# Patient Record
Sex: Female | Born: 1995 | Race: Black or African American | Hispanic: No | Marital: Single | Smoking: Current some day smoker
Health system: Southern US, Community
[De-identification: ages and names within clinical notes are randomized; demographics above are authoritative.]

## PROBLEM LIST (undated history)

## (undated) ENCOUNTER — Inpatient Hospital Stay (HOSPITAL_COMMUNITY): Payer: Self-pay

## (undated) DIAGNOSIS — D649 Anemia, unspecified: Secondary | ICD-10-CM

## (undated) DIAGNOSIS — N189 Chronic kidney disease, unspecified: Secondary | ICD-10-CM

## (undated) DIAGNOSIS — F419 Anxiety disorder, unspecified: Secondary | ICD-10-CM

## (undated) DIAGNOSIS — J45909 Unspecified asthma, uncomplicated: Secondary | ICD-10-CM

## (undated) HISTORY — PX: NO PAST SURGERIES: SHX2092

## (undated) HISTORY — PX: WISDOM TOOTH EXTRACTION: SHX21

---
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
## 2013-11-18 NOTE — L&D Delivery Note (Signed)
Delivery Note At 8:44 AM a viable and healthy female was delivered via  (Presentation: LOA ).  APGAR: 8, 8; weight: pending .   Placenta status: Intact, Spontaneous.  Cord: 3V  with the following complications: none .  Cord pH: n/a  Anesthesia: Epidural  Episiotomy: n/a Lacerations: n/a Suture Repair: n/a Est. Blood Loss (mL): 250  Mom to postpartum.  Baby to Couplet care / Skin to Skin.  Pt pushed with good maternal effort to deliver a liveborn female via NSVD with spontaneous cry.   Baby placed on maternal abdomen.  Delayed cord clamping performed.  Cord cut by MOB.  Placenta delivered intact with 3V cord via traction and pitocin.  No tear. No complications.  Mom and baby to postpartum.   Myra RudeSchmitz, Jeremy E 05/12/2014, 8:58 AM   I was present for and supervised the delivery of this newborn. I agree with above documentation by the resident.   BECK, Redmond BasemanKELI L, MD

## 2014-01-23 ENCOUNTER — Encounter (HOSPITAL_COMMUNITY): Payer: Self-pay | Admitting: Emergency Medicine

## 2014-01-23 ENCOUNTER — Emergency Department (HOSPITAL_COMMUNITY)
Admission: EM | Admit: 2014-01-23 | Discharge: 2014-01-24 | Disposition: A | Discharge status: Home / Self Care | Payer: Medicaid Other | Attending: Emergency Medicine | Admitting: Emergency Medicine

## 2014-01-23 DIAGNOSIS — R011 Cardiac murmur, unspecified: Secondary | ICD-10-CM | POA: Insufficient documentation

## 2014-01-23 DIAGNOSIS — R110 Nausea: Secondary | ICD-10-CM | POA: Insufficient documentation

## 2014-01-23 DIAGNOSIS — O99019 Anemia complicating pregnancy, unspecified trimester: Secondary | ICD-10-CM | POA: Insufficient documentation

## 2014-01-23 DIAGNOSIS — D649 Anemia, unspecified: Secondary | ICD-10-CM

## 2014-01-23 DIAGNOSIS — O9989 Other specified diseases and conditions complicating pregnancy, childbirth and the puerperium: Secondary | ICD-10-CM | POA: Insufficient documentation

## 2014-01-23 DIAGNOSIS — J45909 Unspecified asthma, uncomplicated: Secondary | ICD-10-CM | POA: Insufficient documentation

## 2014-01-23 DIAGNOSIS — R51 Headache: Secondary | ICD-10-CM | POA: Insufficient documentation

## 2014-01-23 DIAGNOSIS — R519 Headache, unspecified: Secondary | ICD-10-CM

## 2014-01-23 DIAGNOSIS — R42 Dizziness and giddiness: Secondary | ICD-10-CM | POA: Insufficient documentation

## 2014-01-23 LAB — CBC WITH DIFFERENTIAL/PLATELET
BASOS PCT: 0 % (ref 0–1)
Basophils Absolute: 0 10*3/uL (ref 0.0–0.1)
Eosinophils Absolute: 0.3 10*3/uL (ref 0.0–0.7)
Eosinophils Relative: 3 % (ref 0–5)
HEMATOCRIT: 26 % — AB (ref 36.0–46.0)
HEMOGLOBIN: 8.1 g/dL — AB (ref 12.0–15.0)
LYMPHS ABS: 1.7 10*3/uL (ref 0.7–4.0)
LYMPHS PCT: 18 % (ref 12–46)
MCH: 23.3 pg — ABNORMAL LOW (ref 26.0–34.0)
MCHC: 31.2 g/dL (ref 30.0–36.0)
MCV: 74.9 fL — ABNORMAL LOW (ref 78.0–100.0)
MONO ABS: 0.5 10*3/uL (ref 0.1–1.0)
MONOS PCT: 5 % (ref 3–12)
NEUTROS ABS: 7.4 10*3/uL (ref 1.7–7.7)
NEUTROS PCT: 75 % (ref 43–77)
Platelets: 309 10*3/uL (ref 150–400)
RBC: 3.47 MIL/uL — AB (ref 3.87–5.11)
RDW: 17 % — ABNORMAL HIGH (ref 11.5–15.5)
WBC: 9.8 10*3/uL (ref 4.0–10.5)

## 2014-01-23 LAB — COMPREHENSIVE METABOLIC PANEL
ALK PHOS: 63 U/L (ref 39–117)
ALT: 7 U/L (ref 0–35)
AST: 15 U/L (ref 0–37)
Albumin: 2.8 g/dL — ABNORMAL LOW (ref 3.5–5.2)
BUN: 5 mg/dL — ABNORMAL LOW (ref 6–23)
CALCIUM: 8.7 mg/dL (ref 8.4–10.5)
CO2: 23 meq/L (ref 19–32)
Chloride: 105 mEq/L (ref 96–112)
Creatinine, Ser: 0.47 mg/dL — ABNORMAL LOW (ref 0.50–1.10)
GFR calc Af Amer: >90 mL/min (ref >90)
GLUCOSE: 82 mg/dL (ref 70–99)
POTASSIUM: 3.4 meq/L — AB (ref 3.7–5.3)
SODIUM: 139 meq/L (ref 137–147)
Total Bilirubin: <0.2 mg/dL — ABNORMAL LOW (ref 0.3–1.2)
Total Protein: 6.3 g/dL (ref 6.0–8.3)

## 2014-01-23 LAB — URINALYSIS, ROUTINE W REFLEX MICROSCOPIC
Bilirubin Urine: NEGATIVE
GLUCOSE, UA: NEGATIVE mg/dL
HGB URINE DIPSTICK: NEGATIVE
Ketones, ur: NEGATIVE mg/dL
LEUKOCYTES UA: NEGATIVE
Nitrite: NEGATIVE
PH: 7 (ref 5.0–8.0)
PROTEIN: NEGATIVE mg/dL
SPECIFIC GRAVITY, URINE: 1.015 (ref 1.005–1.030)
Urobilinogen, UA: 0.2 mg/dL (ref 0.0–1.0)

## 2014-01-23 LAB — POC URINE PREG, ED: Preg Test, Ur: POSITIVE — AB

## 2014-01-23 NOTE — ED Notes (Signed)
Pt states LMP September. States she has not seen MD for prenatal care

## 2014-01-23 NOTE — ED Notes (Addendum)
Pt reports migraine x 3 days with sensitivity to light and loud noises. Pt has took tylenol at home with no relief. No hx migraine in past. Pt also reports that she found out 2 weeks ago that she is pregnant.  She has been experiencing lower sharp abdominal pain with nausea for 2 weeks and feeling shakey all over. Reports some abnormal vaginal discharge.

## 2014-01-23 NOTE — ED Provider Notes (Signed)
CSN: [id number]     Arrival date & time 01/23/14  1940 History   First MD Initiated Contact with Patient 01/23/14 2144     Chief Complaint  Patient presents with  . Migraine     (Consider location/radiation/quality/duration/timing/severity/associated sxs/prior Treatment) HPI Comments: Yvonne Castillo is a 18 y.o. G3P2 female with a past medical history of asthma presenting the Emergency Department with a chief complaint of headache for 3 days.  She reports a gradual onset of a frontal headache.  She reports taking tylenol for the discomfort without relief of symptoms.  She reports associated intermittent vertigo dizziness.  She reports dizziness is not aggravated by head position, body position (seated to standing).  She denies fever or rash.  Reports occasional nausea. Denies abdominal pain, abnormal vaginal bleeding or vaginal discharge. She reports she has not been evaluated for her current pregnancy.  Previous OB/GYN, [facility] [place]. LNMP: September or October 2014. No PCP  Patient is a 18 y.o. female presenting with migraines. The history is provided by the patient. No language interpreter was used.  Migraine Associated symptoms include headaches and nausea. Pertinent negatives include no abdominal pain, chills, congestion, fever, numbness, rash or weakness.    Past Medical History  Diagnosis Date  . Asthma    History reviewed. No pertinent past surgical history. No family history on file. History  Substance Use Topics  . Smoking status: Never Smoker   . Smokeless tobacco: Not on file  . Alcohol Use: No   OB History   Grav Para Term Preterm Abortions TAB SAB Ect Mult Living                 Review of Systems  Constitutional: Negative for fever and chills.  HENT: Negative for congestion, ear pain, sinus pressure and tinnitus.   Eyes: Negative for photophobia and visual disturbance.  Gastrointestinal: Positive for nausea. Negative for abdominal pain, diarrhea and  constipation.  Genitourinary: Negative for dysuria, urgency, hematuria, vaginal bleeding, vaginal discharge and menstrual problem.  Skin: Negative for rash.  Neurological: Positive for dizziness and headaches. Negative for seizures, syncope, weakness, light-headedness and numbness.      Allergies  Review of patient's allergies indicates no known allergies.  Home Medications   Current Outpatient Rx  Name  Route  Sig  Dispense  Refill  . acetaminophen (TYLENOL) 500 MG tablet   Oral   Take 1,000 mg by mouth every 8 (eight) hours as needed for moderate pain.          BP 102/56  Pulse 78  Temp(Src) 98.5 F (36.9 C) (Oral)  Resp 16  SpO2 100%  LMP 08/25/2013 Physical Exam  Nursing note and vitals reviewed. Constitutional: She is oriented to person, place, and time. She appears well-developed and well-nourished. No distress.  HENT:  Head: Normocephalic and atraumatic.  Eyes: EOM are normal. Pupils are equal, round, and reactive to light. Right eye exhibits no nystagmus. Left eye exhibits no nystagmus.  Speech is clear and goal oriented, follows commands Cranial nerves III - XII grossly intact, no facial droop Normal strength in upper and lower extremities bilaterally, strong and equal grip strength Sensation normal to light and sharp touch Moves all 4 extremities without ataxia, coordination intact, normal gait.     Neck: Normal range of motion. Neck supple.  Cardiovascular: Normal rate and regular rhythm.   Murmur heard. Pulmonary/Chest: Effort normal and breath sounds normal. She has no wheezes. She has no rales.  Abdominal: Soft. There  is no tenderness. There is no rebound.  Gravid uterus, fundal height measuring 27 cm  Neurological: She is alert and oriented to person, place, and time. No cranial nerve deficit. Coordination normal.  Skin: Skin is warm. She is not diaphoretic.    ED Course  Procedures (including critical care time) Labs Review Labs Reviewed  CBC  WITH DIFFERENTIAL - Abnormal; Notable for the following:    RBC 3.47 (*)    Hemoglobin 8.1 (*)    HCT 26.0 (*)    MCV 74.9 (*)    MCH 23.3 (*)    RDW 17.0 (*)    All other components within normal limits  COMPREHENSIVE METABOLIC PANEL - Abnormal; Notable for the following:    Potassium 3.4 (*)    BUN 5 (*)    Creatinine, Ser 0.47 (*)    Albumin 2.8 (*)    Total Bilirubin <0.2 (*)    All other components within normal limits  POC URINE PREG, ED - Abnormal; Notable for the following:    Preg Test, Ur POSITIVE (*)    All other components within normal limits  URINALYSIS, ROUTINE W REFLEX MICROSCOPIC   Imaging Review No results found.   EKG Interpretation None      MDM   Final diagnoses:  Headache  Anemia  Murmur   Pt currently pregnant pt without having prenatal care presents with gradual onset of a Right sided and frontal head ache with associated dizziness. No neurologic deficits on exam, specifially no nystagmys, peripheral vision loss. Bedside US performed, IUP, with a HR approximately 160 BPM. Reglan and benadryl ordered. 0119: Pt reports headache improved. Discussed lab results,and treatment plan with the patient.  Discussed establishing care with an OB/GYN in the area. Return precautions given. Reports understanding and no other concerns at this time.  Patient is stable for discharge at this time.  Meds given in ED:  Medications  metoCLOPramide (REGLAN) injection 10 mg (10 mg Intravenous Given 01/24/14 0018)  diphenhydrAMINE (BENADRYL) injection 25 mg (25 mg Intravenous Given 01/24/14 0016)  sodium chloride 0.9 % bolus 1,000 mL (1,000 mLs Intravenous New Bag/Given 01/24/14 0019)    New Prescriptions   No medications on file        Clabe SealLauren M Marius Betts, PA-C 01/25/14 1442

## 2014-01-23 NOTE — ED Notes (Signed)
Parker PA at bedside.

## 2014-01-24 MED ORDER — METOCLOPRAMIDE HCL 5 MG/ML IJ SOLN
10.0000 mg | Freq: Once | INTRAMUSCULAR | Status: AC
  Administered 2014-01-24: 10 mg via INTRAVENOUS
  Filled 2014-01-24: qty 2

## 2014-01-24 MED ORDER — DIPHENHYDRAMINE HCL 50 MG/ML IJ SOLN
25.0000 mg | Freq: Once | INTRAMUSCULAR | Status: AC
  Administered 2014-01-24: 25 mg via INTRAVENOUS
  Filled 2014-01-24: qty 1

## 2014-01-24 MED ORDER — SODIUM CHLORIDE 0.9 % IV BOLUS (SEPSIS)
1000.0000 mL | Freq: Once | INTRAVENOUS | Status: AC
  Administered 2014-01-24: 1000 mL via INTRAVENOUS

## 2014-01-24 NOTE — ED Notes (Signed)
Pt states understanding of discharge instructions 

## 2014-01-24 NOTE — Discharge Instructions (Signed)
Call for a follow up appointment with a Family or Primary Care Provider.  Return if Symptoms worsen.   Take tylenol for pain as indicated on the box or bottle.  Take medication as prescribed.

## 2014-01-24 NOTE — ED Provider Notes (Signed)
Medical screening examination/treatment/procedure(s) were conducted as a shared visit with non-physician practitioner(s) and myself.  I personally evaluated the patient during the encounter.  G3P2 LMP Sep? Gradual onset headache over days, no fever or sudden headache, no focal neuro Sxs, has intermittent nonpositional spells few minutes at a time of vague dizziness without nausea can't differentiate as vertigo vs light-headedness, occurs several times over last several days, no abd pain or vag bleed, gravid UT 3-4cm above umbillicus, limited bedside US IUP FHR 160, EGA BPD ~26 weeks  Doubt SAH, CVA, SBI.  Yvonne HornJohn M Kaimana Lurz, MD 01/24/14 [id number]

## 2014-02-04 NOTE — ED Provider Notes (Signed)
Medical screening examination/treatment/procedure(s) were performed by non-physician practitioner and as supervising physician I was immediately available for consultation/collaboration.   EKG Interpretation None       Zayaan Kozak M Tobin Witucki, MD 02/04/14 0946 

## 2014-02-18 ENCOUNTER — Emergency Department (HOSPITAL_COMMUNITY): Payer: Self-pay

## 2014-02-18 ENCOUNTER — Encounter (HOSPITAL_COMMUNITY): Payer: Self-pay | Admitting: Emergency Medicine

## 2014-02-18 ENCOUNTER — Observation Stay (HOSPITAL_COMMUNITY)
Admission: EM | Admit: 2014-02-18 | Discharge: 2014-02-19 | DRG: 781 | Disposition: A | Discharge status: Home / Self Care | Payer: Medicaid Other | Attending: Obstetrics & Gynecology | Admitting: Obstetrics & Gynecology

## 2014-02-18 DIAGNOSIS — Z3490 Encounter for supervision of normal pregnancy, unspecified, unspecified trimester: Secondary | ICD-10-CM

## 2014-02-18 DIAGNOSIS — N200 Calculus of kidney: Secondary | ICD-10-CM

## 2014-02-18 DIAGNOSIS — N1330 Unspecified hydronephrosis: Secondary | ICD-10-CM

## 2014-02-18 DIAGNOSIS — O321XX0 Maternal care for breech presentation, not applicable or unspecified: Secondary | ICD-10-CM

## 2014-02-18 DIAGNOSIS — O26839 Pregnancy related renal disease, unspecified trimester: Principal | ICD-10-CM | POA: Diagnosis present

## 2014-02-18 DIAGNOSIS — R109 Unspecified abdominal pain: Secondary | ICD-10-CM | POA: Diagnosis present

## 2014-02-18 DIAGNOSIS — O99330 Smoking (tobacco) complicating pregnancy, unspecified trimester: Secondary | ICD-10-CM | POA: Diagnosis present

## 2014-02-18 DIAGNOSIS — O0930 Supervision of pregnancy with insufficient antenatal care, unspecified trimester: Secondary | ICD-10-CM

## 2014-02-18 DIAGNOSIS — R51 Headache: Secondary | ICD-10-CM | POA: Diagnosis present

## 2014-02-18 LAB — COMPREHENSIVE METABOLIC PANEL
ALT: 9 U/L (ref 0–35)
AST: 17 U/L (ref 0–37)
Albumin: 2.8 g/dL — ABNORMAL LOW (ref 3.5–5.2)
Alkaline Phosphatase: 85 U/L (ref 39–117)
BUN: 5 mg/dL — AB (ref 6–23)
CALCIUM: 8.4 mg/dL (ref 8.4–10.5)
CO2: 19 meq/L (ref 19–32)
CREATININE: 0.52 mg/dL (ref 0.50–1.10)
Chloride: 103 mEq/L (ref 96–112)
GLUCOSE: 96 mg/dL (ref 70–99)
Potassium: 3.4 mEq/L — ABNORMAL LOW (ref 3.7–5.3)
Sodium: 136 mEq/L — ABNORMAL LOW (ref 137–147)
Total Bilirubin: <0.2 mg/dL — ABNORMAL LOW (ref 0.3–1.2)
Total Protein: 6.5 g/dL (ref 6.0–8.3)

## 2014-02-18 LAB — URINALYSIS, ROUTINE W REFLEX MICROSCOPIC
BILIRUBIN URINE: NEGATIVE
Glucose, UA: NEGATIVE mg/dL
KETONES UR: NEGATIVE mg/dL
Nitrite: NEGATIVE
PH: 6.5 (ref 5.0–8.0)
Protein, ur: NEGATIVE mg/dL
SPECIFIC GRAVITY, URINE: 1.021 (ref 1.005–1.030)
Urobilinogen, UA: 0.2 mg/dL (ref 0.0–1.0)

## 2014-02-18 LAB — CBC WITH DIFFERENTIAL/PLATELET
Basophils Absolute: 0 10*3/uL (ref 0.0–0.1)
Basophils Relative: 0 % (ref 0–1)
EOS PCT: 2 % (ref 0–5)
Eosinophils Absolute: 0.2 10*3/uL (ref 0.0–0.7)
HEMATOCRIT: 25 % — AB (ref 36.0–46.0)
Hemoglobin: 7.8 g/dL — ABNORMAL LOW (ref 12.0–15.0)
LYMPHS ABS: 2.1 10*3/uL (ref 0.7–4.0)
LYMPHS PCT: 17 % (ref 12–46)
MCH: 22.8 pg — AB (ref 26.0–34.0)
MCHC: 31.2 g/dL (ref 30.0–36.0)
MCV: 73.1 fL — AB (ref 78.0–100.0)
MONO ABS: 0.5 10*3/uL (ref 0.1–1.0)
MONOS PCT: 4 % (ref 3–12)
Neutro Abs: 9.5 10*3/uL — ABNORMAL HIGH (ref 1.7–7.7)
Neutrophils Relative %: 77 % (ref 43–77)
Platelets: 274 10*3/uL (ref 150–400)
RBC: 3.42 MIL/uL — AB (ref 3.87–5.11)
RDW: 17.6 % — ABNORMAL HIGH (ref 11.5–15.5)
WBC: 12.3 10*3/uL — AB (ref 4.0–10.5)

## 2014-02-18 LAB — HEPATITIS B SURFACE ANTIGEN: Hepatitis B Surface Ag: NEGATIVE

## 2014-02-18 LAB — TYPE AND SCREEN
ABO/RH(D): O POS
Antibody Screen: NEGATIVE

## 2014-02-18 LAB — RAPID URINE DRUG SCREEN, HOSP PERFORMED
Amphetamines: NOT DETECTED
BENZODIAZEPINES: NOT DETECTED
Barbiturates: NOT DETECTED
COCAINE: NOT DETECTED
OPIATES: POSITIVE — AB
Tetrahydrocannabinol: NOT DETECTED

## 2014-02-18 LAB — OB RESULTS CONSOLE HIV ANTIBODY (ROUTINE TESTING): HIV: NONREACTIVE

## 2014-02-18 LAB — URINE MICROSCOPIC-ADD ON

## 2014-02-18 LAB — HCG, QUANTITATIVE, PREGNANCY: hCG, Beta Chain, Quant, S: 15639 m[IU]/mL — ABNORMAL HIGH (ref <5)

## 2014-02-18 LAB — ABO/RH: ABO/RH(D): O POS

## 2014-02-18 LAB — RPR: RPR Ser Ql: NONREACTIVE

## 2014-02-18 LAB — RAPID HIV SCREEN (WH-MAU): Rapid HIV Screen: NONREACTIVE

## 2014-02-18 MED ORDER — MORPHINE SULFATE 4 MG/ML IJ SOLN
4.0000 mg | Freq: Once | INTRAMUSCULAR | Status: AC
  Administered 2014-02-18: 4 mg via INTRAVENOUS
  Filled 2014-02-18: qty 1

## 2014-02-18 MED ORDER — ZOLPIDEM TARTRATE 5 MG PO TABS: 5.0000 mg | ORAL_TABLET | Freq: Every evening | ORAL | Status: DC | PRN

## 2014-02-18 MED ORDER — DOCUSATE SODIUM 100 MG PO CAPS
100.0000 mg | ORAL_CAPSULE | Freq: Every day | ORAL | Status: DC
  Administered 2014-02-18 – 2014-02-19 (×2): 100 mg via ORAL
  Filled 2014-02-18 (×3): qty 1

## 2014-02-18 MED ORDER — ACETAMINOPHEN 325 MG PO TABS: 650.0000 mg | ORAL_TABLET | ORAL | Status: DC | PRN

## 2014-02-18 MED ORDER — TAMSULOSIN HCL 0.4 MG PO CAPS
0.4000 mg | ORAL_CAPSULE | Freq: Every day | ORAL | Status: DC
  Administered 2014-02-18 – 2014-02-19 (×2): 0.4 mg via ORAL
  Filled 2014-02-18 (×3): qty 1

## 2014-02-18 MED ORDER — ONDANSETRON HCL 4 MG/2ML IJ SOLN
4.0000 mg | Freq: Four times a day (QID) | INTRAMUSCULAR | Status: DC | PRN
  Administered 2014-02-18: 4 mg via INTRAVENOUS
  Filled 2014-02-18: qty 2

## 2014-02-18 MED ORDER — PRENATAL MULTIVITAMIN CH
1.0000 | ORAL_TABLET | Freq: Every day | ORAL | Status: DC
  Administered 2014-02-18 – 2014-02-19 (×2): 1 via ORAL
  Filled 2014-02-18 (×3): qty 1

## 2014-02-18 MED ORDER — MORPHINE SULFATE 4 MG/ML IJ SOLN
4.0000 mg | INTRAMUSCULAR | Status: DC | PRN
  Administered 2014-02-18 – 2014-02-19 (×3): 4 mg via INTRAVENOUS
  Filled 2014-02-18 (×3): qty 1

## 2014-02-18 MED ORDER — KETOROLAC TROMETHAMINE 30 MG/ML IJ SOLN
30.0000 mg | Freq: Once | INTRAMUSCULAR | Status: AC
  Administered 2014-02-18: 30 mg via INTRAVENOUS
  Filled 2014-02-18: qty 1

## 2014-02-18 MED ORDER — LACTATED RINGERS IV SOLN
INTRAVENOUS | Status: DC
  Administered 2014-02-18 – 2014-02-19 (×4): via INTRAVENOUS

## 2014-02-18 MED ORDER — CALCIUM CARBONATE ANTACID 500 MG PO CHEW
2.0000 | CHEWABLE_TABLET | ORAL | Status: DC | PRN
  Filled 2014-02-18: qty 2

## 2014-02-18 NOTE — ED Notes (Signed)
Patient states she got out of the bathtub about 10pm and her right flank area starting hurting.  No vaginal discharge noted.  Monitor applied to patient and heartbeat 160's.

## 2014-02-18 NOTE — ED Notes (Addendum)
Pt. reports right flank pain / right lateral abdominal pain  with diarrhea onset this evening denies hematuria or dysuria , no nausea or vomitting . Pt. stated she is 7 months pregnant ( G3P2) , Denies vaginal discharge or bleeding .

## 2014-02-18 NOTE — ED Notes (Signed)
Pt states unable to urinated at this time.

## 2014-02-18 NOTE — Progress Notes (Signed)
meds given for nausea.  

## 2014-02-18 NOTE — [facility] Note (Signed)
Pt received by [facility], pt states she had onset of severe lower abd & R flank pain, sharp vaginal pain.  Denies bleeding or LOF.

## 2014-02-18 NOTE — ED Notes (Signed)
Notified [facility] for transportation to [facility] 

## 2014-02-18 NOTE — ED Provider Notes (Addendum)
CSN: [id number]     Arrival date & time 02/18/14  0215 History   First MD Initiated Contact with Patient 02/18/14 0304     Chief Complaint  Patient presents with  . Flank Pain  . Abdominal Pain     (Consider location/radiation/quality/duration/timing/severity/associated sxs/prior Treatment) HPI Comments: 18 yo G3 P2 at 7 months EGA presents to ER with 2 hrs of R lower back pain and R abd pain.  She denies VB, VD, LOF, or decreased fetal movement.    She denies trauma.  She denies similar symptoms.  Pt has a h/o kidney stones at age 18.    NKDA Meds: Tylenol PMH: none PSH: none  Patient is a 18 y.o. female presenting with flank pain and abdominal pain. The history is provided by the patient.  Flank Pain This is a new problem. The current episode started 1 to 2 hours ago. The problem occurs constantly. The problem has not changed since onset.Associated symptoms include abdominal pain. Pertinent negatives include no chest pain, no headaches and no shortness of breath. The symptoms are aggravated by twisting. Nothing relieves the symptoms. She has tried nothing for the symptoms.  Abdominal Pain Associated symptoms: no chest pain, no constipation, no cough, no diarrhea, no dysuria, no hematuria, no nausea, no shortness of breath, no vaginal bleeding, no vaginal discharge and no vomiting     Past Medical History  Diagnosis Date  . Asthma    History reviewed. No pertinent past surgical history. No family history on file. History  Substance Use Topics  . Smoking status: Never Smoker   . Smokeless tobacco: Not on file  . Alcohol Use: No   OB History   Grav Para Term Preterm Abortions TAB SAB Ect Mult Living   3 2             Review of Systems  Constitutional: Negative.   Eyes: Negative.   Respiratory: Negative.  Negative for apnea, cough, choking, chest tightness, shortness of breath, wheezing and stridor.   Cardiovascular: Negative for chest pain, palpitations and leg swelling.   Gastrointestinal: Positive for abdominal pain. Negative for nausea, vomiting, diarrhea, constipation, blood in stool, abdominal distention, anal bleeding and rectal pain.  Genitourinary: Positive for flank pain. Negative for dysuria, urgency, frequency, hematuria, decreased urine volume, vaginal bleeding, vaginal discharge, enuresis, genital sores, vaginal pain, menstrual problem, pelvic pain and dyspareunia.       G3P2 at 7 months  Musculoskeletal: Positive for back pain. Negative for arthralgias, gait problem, joint swelling, myalgias, neck pain and neck stiffness.  Skin: Negative.   Neurological: Negative for headaches.      Allergies  Review of patient's allergies indicates no known allergies.  Home Medications   No current outpatient prescriptions on file. BP 93/52  Pulse 83  Temp(Src) 98.3 F (36.8 C) (Oral)  Resp 21  SpO2 100%  LMP 08/25/2013 Physical Exam  Nursing note and vitals reviewed. Constitutional: She is oriented to person, place, and time. She appears well-developed and well-nourished. No distress.  HENT:  Head: Normocephalic and atraumatic.  Cardiovascular: Normal rate and regular rhythm.  Exam reveals no friction rub.   No murmur heard. Pulmonary/Chest: Effort normal and breath sounds normal. She has no wheezes. She has no rales.  Abdominal: Soft. Bowel sounds are normal. She exhibits no distension and no mass. There is no hepatosplenomegaly. There is tenderness. There is no rebound, no guarding and no CVA tenderness.    gravid  Musculoskeletal: She exhibits no edema and no  tenderness.  Neurological: She is alert and oriented to person, place, and time. She has normal reflexes. No cranial nerve deficit.  Skin: Skin is warm and dry. She is not diaphoretic.    ED Course  Procedures (including critical care time) Labs Review Labs Reviewed  URINALYSIS, ROUTINE W REFLEX MICROSCOPIC - Abnormal; Notable for the following:    APPearance CLOUDY (*)    Hgb  urine dipstick MODERATE (*)    Leukocytes, UA SMALL (*)    All other components within normal limits  HCG, QUANTITATIVE, PREGNANCY - Abnormal; Notable for the following:    hCG, Beta Chain, Quant, S 13244 (*)    All other components within normal limits  CBC WITH DIFFERENTIAL - Abnormal; Notable for the following:    WBC 12.3 (*)    RBC 3.42 (*)    Hemoglobin 7.8 (*)    HCT 25.0 (*)    MCV 73.1 (*)    MCH 22.8 (*)    RDW 17.6 (*)    Neutro Abs 9.5 (*)    All other components within normal limits  COMPREHENSIVE METABOLIC PANEL - Abnormal; Notable for the following:    Sodium 136 (*)    Potassium 3.4 (*)    BUN 5 (*)    Albumin 2.8 (*)    Total Bilirubin <0.2 (*)    All other components within normal limits  URINE MICROSCOPIC-ADD ON   Imaging Review US Abdomen Complete  02/18/2014   CLINICAL DATA:  Abdominal pain.  EXAM: ULTRASOUND ABDOMEN COMPLETE  COMPARISON:  CT of the abdomen and pelvis performed 09/08/2010  FINDINGS: Gallbladder:  No gallstones or wall thickening visualized. No sonographic Murphy sign noted.  Common bile duct:  Diameter: 0.3 cm, within normal limits in caliber.  Liver:  No focal lesion identified. Within normal limits in parenchymal echogenicity.  IVC:  No abnormality visualized.  Pancreas:  Visualized portion unremarkable.  Spleen:  Size and appearance within normal limits.  Right Kidney:  Length: 11.9 cm. Echogenicity within normal limits. No mass visualized. Moderate right-sided hydronephrosis is noted.  Left Kidney:  Length: 11.1 cm. Echogenicity within normal limits. No mass or hydronephrosis visualized.  Abdominal aorta:  No aneurysm visualized.  Other findings:  None.  IMPRESSION: Moderate right-sided hydronephrosis, raising suspicion for distal obstruction. The bladder is not well assessed on this study due to decompression.   Electronically Signed   By: Roanna Raider M.D.   On: 02/18/2014 05:51     EKG Interpretation None      EMERGENCY DEPARTMENT [name]  PREGNANCY "Study: Limited Ultrasound of the Pelvis"  INDICATIONS:Pregnancy(required) Multiple views of the uterus and pelvic cavity are obtained with a multi-frequency probe.  APPROACH:Transabdominal   PERFORMED BY: Myself  IMAGES ARCHIVED?: No  LIMITATIONS: Emergent procedure  PREGNANCY FREE FLUID: None  PREGNANCY UTERUS FINDINGS:Uterus enlarged ADNEXAL FINDINGS:Left ovary not seen and Right ovary not seen  PREGNANCY FINDINGS: Fetus noted; CA 130s, FM noted  INTERPRETATION: Viable intrauterine pregnancy  GESTATIONAL AGE, ESTIMATE: 7 months  FETAL HEART RATE: 130s  US Renal ER Indication: R flank pain Approach: Transabd Performed by Me Images archived no Interpretation: mod hydroneprhosis, no stone      MDM   Final diagnoses:  Pregnancy  Nephrolithiasis  Anemia - H/H 7.8/25 Bacteruria     18 year old G3 P2 at 7 months estimated gestational age presents to emergency department with chief complaint of severe right flank pain. Patient is afebrile her vital signs are stable. She does have severe right flank pain.  ER course: Stat OB ultrasound performed at the bedside which revealed a viable intrauterine pregnancy with fetal heart tones in the 140s with good fetal movement. Bedside renal ultrasound revealed moderate hydronephrosis. CBC: White blood cells 12.3, hemoglobin 7.8, hematocrit 25, platelets 274 CMP: Unremarkable Urinalysis: Moderate hemoglobin, small leukocytes, 7-10 red blood cells, rare bacteria HCG: 15,639  The patient was placed on fetal cardiac monitor and toco monitor: No evidence of contraction and fetal heart tracing was reassuring based on OB nurse interpretation.  Formal ultrasound concerning for right-sided hydronephrosis. Based on clinical picture of severe acute onset right flank pain, history of kidney stones, blood in urine, and ultrasound findings I suspect patient has nephrolithiasis during her pregnancy. [facility] Ernesta Amble who accepted patient in transfer. I reviewed H and P and all lab and imaging results with Dr. Dolan Amen.  Pt should report to [facility]U on arrival.  Pt was updated and aware of plan.  Risks vs Benefits were discussed.  Of note pelvic exam was not performed.  Pt denies VB, VD, LOF, or decrease FM.  Additionally there is no evidence of active labor.    7:30 AM VSS and in nad.  Pt awaiting transfer to [facility].  Pt t/o to Dr. Gwendolyn Grant pending transfer.      Darlys Gales, MD 02/18/14 [phone]

## 2014-02-18 NOTE — Progress Notes (Signed)
Called for pt with abd pain. Pt denies any problems with this pregnancy. She has not had PNC due to medicare. Pt states no problems with past pregnancy. Pt states this pain is not like labor. It is constant and sudden onset not contractsions. Abd soft non tender. No UCs noted or palpated. FHR 150's reassuring.

## 2014-02-18 NOTE — H&P (Signed)
Yvonne Castillo is a 18 y.o. female G3P2 with IUP at [redacted]w[redacted]d presenting from [facility] for nephrolithiasis. Pt was diagnosed by [name] with Right hydronephrosis. Patient describes 8/10 pain that began at midnight on her right lower quadrant and began to radiate to her back. Patient states initially she did not have blood in her urine, however while in MAU patient began to have red urine. Patient has history of kidney stones when she was 13  Patient's had no prenatal care for this point. Patient states that has been issued insurance. Patient has been in [place] and recently returned to the area. On review of history patient reports a vaginal delivery approximately a year ago, no drug or alcohol use. Occasionally smokes black and milds  Past Medical History: Past Medical History  Diagnosis Date  . Asthma     Past Surgical History: Past Surgical History  Procedure Laterality Date  . No past surgeries      Obstetrical History: OB History   Grav Para Term Preterm Abortions TAB SAB Ect Mult Living   3 2              Gynecological History: OB History   Grav Para Term Preterm Abortions TAB SAB Ect Mult Living   3 2              Social History: History   Social History  . Marital Status: Single    Spouse Name: N/A    Number of Children: N/A  . Years of Education: N/A   Social History Main Topics  . Smoking status: Current Every Day Smoker -- 0.25 packs/day    Types: Cigarettes  . Smokeless tobacco: None  . Alcohol Use: No  . Drug Use: None  . Sexual Activity: Yes   Other Topics Concern  . None   Social History Narrative  . None    Family History: History reviewed. No pertinent family history.  Allergies: No Known Allergies  No prescriptions prior to admission     Review of Systems   Constitutional: Patient endorses daily headaches for last 2 weeks but none currently, no vision changes, chest pain, nausea, vomiting, diarrhea, constipation, increased urinary frequency  or dysuria prior to current pain. Patient states prior to 12:00 last night she was in her normal state of health  Blood pressure 113/59, pulse 118, temperature 98.8 F (37.1 C), temperature source Oral, resp. rate 16, last menstrual period 08/25/2013, SpO2 99.00%. General appearance: alert, cooperative, appears stated age and mild distress Lungs: clear to auscultation bilaterally Heart: regular rate and rhythm Abdomen: soft, non-tender; bowel sounds normal SSE: No blood in the vault, did not check her cervix based on ultrasound has a cervical length of 4.6 cm Extremities: Homans sign is negative, no sign of DVT DTR's 2 + Presentation: breech Fetal monitoring 140s, moderate ability, 10 x 10 accelerations, no decelerations Uterine activityNone Dilation: Closed Effacement (%): Thick Station: Ballotable Exam by:: JDaley   Prenatal Transfer Tool  Maternal Diabetes: No prenatal testing Genetic Screening: Too late to care Maternal Ultrasounds/Referrals: Normal Fetal Ultrasounds or other Referrals:  None Maternal Substance Abuse:  UDS pending Significant Maternal Medications:  None Significant Maternal Lab Results: None  CLINICAL DATA: Right flank pain.  EXAM:  LIMITED OBSTETRIC ULTRASOUND  FINDINGS:  Number of Fetuses: 1  Heart Rate: 141 bpm  Movement: Yes  Presentation: Breech  Placental Location: Posterior fundal  Previa: No  Amniotic Fluid (Subjective): Within normal limits.  BPD: 6.84 cm 27 w 3 d  MATERNAL FINDINGS:  Cervix: Appears closed, measuring 4.6 cm in length.  Uterus/Adnexae: No abnormality visualized.  IMPRESSION:  Single live intrauterine pregnancy noted, with a biparietal diameter  of 6.8 cm, corresponding to a gestational age of [redacted] weeks 3 days.  This reflects an estimated date of delivery of June 30th, 2015. No  evidence of placenta previa; the cervix remains closed, measuring  4.6 cm in length.  This exam is performed on an emergent basis and does not   comprehensively evaluate fetal size, dating, or anatomy; follow-up  complete OB US should be considered if further fetal assessment is  warranted.  Electronically Signed  By: Roanna Raider M.D.  On: 02/18/2014 06:43    CLINICAL DATA: Abdominal pain.  EXAM:  ULTRASOUND ABDOMEN COMPLETE  COMPARISON: CT of the abdomen and pelvis performed 09/08/2010  FINDINGS:  Gallbladder:  No gallstones or wall thickening visualized. No sonographic Murphy  sign noted.  Common bile duct:  Diameter: 0.3 cm, within normal limits in caliber.  Liver:  No focal lesion identified. Within normal limits in parenchymal  echogenicity.  IVC:  No abnormality visualized.  Pancreas:  Visualized portion unremarkable.  Spleen:  Size and appearance within normal limits.  Right Kidney:  Length: 11.9 cm. Echogenicity within normal limits. No mass  visualized. Moderate right-sided hydronephrosis is noted.  Left Kidney:  Length: 11.1 cm. Echogenicity within normal limits. No mass or  hydronephrosis visualized.  Abdominal aorta:  No aneurysm visualized.  Other findings:  None.  IMPRESSION:  Moderate right-sided hydronephrosis, raising suspicion for distal  obstruction. The bladder is not well assessed on this study due to  decompression.  Electronically Signed  By: Roanna Raider M.D.  On: 02/18/2014 05:51   Results for orders placed during the hospital encounter of 02/18/14 (from the past 24 hour(s))  HCG, QUANTITATIVE, PREGNANCY   Collection Time    02/18/14  2:59 AM      Result Value Ref Range   hCG, Beta Chain, Sharene Butters, [place] [address] (*) <5 mIU/mL  CBC WITH DIFFERENTIAL   Collection Time    02/18/14  2:59 AM      Result Value Ref Range   WBC 12.3 (*) 4.0 - 10.5 K/uL   RBC 3.42 (*) 3.87 - 5.11 MIL/uL   Hemoglobin 7.8 (*) 12.0 - 15.0 g/dL   HCT 60.4 (*) 54.0 - 98.1 %   MCV 73.1 (*) 78.0 - 100.0 fL   MCH 22.8 (*) 26.0 - 34.0 pg   MCHC 31.2  30.0 - 36.0 g/dL   RDW 19.1 (*) 47.8 - 29.5 %   Platelets  274  150 - 400 K/uL   Neutrophils Relative % 77  43 - 77 %   Neutro Abs 9.5 (*) 1.7 - 7.7 K/uL   Lymphocytes Relative 17  12 - 46 %   Lymphs Abs 2.1  0.7 - 4.0 K/uL   Monocytes Relative 4  3 - 12 %   Monocytes Absolute 0.5  0.1 - 1.0 K/uL   Eosinophils Relative 2  0 - 5 %   Eosinophils Absolute 0.2  0.0 - 0.7 K/uL   Basophils Relative 0  0 - 1 %   Basophils Absolute 0.0  0.0 - 0.1 K/uL  COMPREHENSIVE METABOLIC PANEL   Collection Time    02/18/14  2:59 AM      Result Value Ref Range   Sodium 136 (*) 137 - 147 mEq/L   Potassium 3.4 (*) 3.7 - 5.3 mEq/L   Chloride 103  96 -  112 mEq/L   CO2 19  19 - 32 mEq/L   Glucose, Bld 96  70 - 99 mg/dL   BUN 5 (*) 6 - 23 mg/dL   Creatinine, Ser 1.610.52  0.50 - 1.10 mg/dL   Calcium 8.4  8.4 - 09.610.5 mg/dL   Total Protein 6.5  6.0 - 8.3 g/dL   Albumin 2.8 (*) 3.5 - 5.2 g/dL   AST 17  0 - 37 U/L   ALT 9  0 - 35 U/L   Alkaline Phosphatase 85  39 - 117 U/L   Total Bilirubin <0.2 (*) 0.3 - 1.2 mg/dL   GFR calc non Af Amer >90  >90 mL/min   GFR calc Af Amer >90  >90 mL/min  URINALYSIS, ROUTINE W REFLEX MICROSCOPIC   Collection Time    02/18/14  3:36 AM      Result Value Ref Range   Color, Urine YELLOW  YELLOW   APPearance CLOUDY (*) CLEAR   Specific Gravity, Urine 1.021  1.005 - 1.030   pH 6.5  5.0 - 8.0   Glucose, UA NEGATIVE  NEGATIVE mg/dL   Hgb urine dipstick MODERATE (*) NEGATIVE   Bilirubin Urine NEGATIVE  NEGATIVE   Ketones, ur NEGATIVE  NEGATIVE mg/dL   Protein, ur NEGATIVE  NEGATIVE mg/dL   Urobilinogen, UA 0.2  0.0 - 1.0 mg/dL   Nitrite NEGATIVE  NEGATIVE   Leukocytes, UA SMALL (*) NEGATIVE  URINE MICROSCOPIC-ADD ON   Collection Time    02/18/14  3:36 AM      Result Value Ref Range   Squamous Epithelial / LPF RARE  RARE   WBC, UA 3-6  <3 WBC/hpf   RBC / HPF 7-10  <3 RBC/hpf   Bacteria, UA RARE  RARE    Assessment: Llesenia Spurrier is a 18 y.o. G3P2 at 6284w3d by R=27 here for acute nephrolithiasis  #Right Nephrolithiasis: likely  based on US and hematuria. Strongly doubt acute abdomen based on exam, hx, and unimpressive WBC on CBC. - toradol 30mg  x1 - Flomax 0.4 qday - LR @ 200cc - if no improvement in 48hrs consider discussing with urology and advanced imaging to measure stone. - [place] PRN change in vitals or temperature - morphine 4mg  q2 hr PRN severe pain.  #PNC: Prenatal labs drawn, anatomy scan will need to be completed and can be scheduled as outpatient at time of discharge. Pt is due for glucola and can be performed prior to discharge as well. Ideally off IV fluids. - reactive and reassuring  Myah Guynes RYAN 02/18/2014, 10:45 AM

## 2014-02-18 NOTE — Progress Notes (Signed)
Pt noted blood on toilet tissue while up to BR, no vaginal blood noted on SSE by Dr. Ike Benedom.

## 2014-02-18 NOTE — ED Notes (Signed)
OB rapid response here to see patient 

## 2014-02-19 DIAGNOSIS — N200 Calculus of kidney: Secondary | ICD-10-CM

## 2014-02-19 DIAGNOSIS — O26839 Pregnancy related renal disease, unspecified trimester: Principal | ICD-10-CM

## 2014-02-19 DIAGNOSIS — O321XX0 Maternal care for breech presentation, not applicable or unspecified: Secondary | ICD-10-CM

## 2014-02-19 DIAGNOSIS — N1330 Unspecified hydronephrosis: Secondary | ICD-10-CM

## 2014-02-19 LAB — RUBELLA SCREEN: Rubella: 1.88 Index — ABNORMAL HIGH (ref <0.90)

## 2014-02-19 LAB — GC/CHLAMYDIA PROBE AMP
CT PROBE, AMP APTIMA: NEGATIVE
GC Probe RNA: NEGATIVE

## 2014-02-19 MED ORDER — HYDROMORPHONE HCL 2 MG PO TABS: 2.0000 mg | ORAL_TABLET | ORAL | Status: DC | PRN

## 2014-02-19 MED ORDER — LACTATED RINGERS IV BOLUS (SEPSIS)
500.0000 mL | Freq: Once | INTRAVENOUS | Status: AC
  Administered 2014-02-19: 500 mL via INTRAVENOUS

## 2014-02-19 MED ORDER — DEXAMETHASONE SODIUM PHOSPHATE 10 MG/ML IJ SOLN
10.0000 mg | Freq: Once | INTRAMUSCULAR | Status: AC
  Administered 2014-02-19: 10 mg via INTRAVENOUS
  Filled 2014-02-19: qty 1

## 2014-02-19 MED ORDER — HYDROMORPHONE HCL 2 MG PO TABS
2.0000 mg | ORAL_TABLET | ORAL | Status: DC | PRN
  Administered 2014-02-19: 2 mg via ORAL
  Filled 2014-02-19: qty 1

## 2014-02-19 MED ORDER — DIPHENHYDRAMINE HCL 50 MG/ML IJ SOLN
12.5000 mg | Freq: Once | INTRAMUSCULAR | Status: AC
  Administered 2014-02-19: 12.5 mg via INTRAVENOUS
  Filled 2014-02-19: qty 1

## 2014-02-19 MED ORDER — PROCHLORPERAZINE EDISYLATE 5 MG/ML IJ SOLN
10.0000 mg | Freq: Four times a day (QID) | INTRAMUSCULAR | Status: DC | PRN
  Administered 2014-02-19: 10 mg via INTRAVENOUS
  Filled 2014-02-19: qty 2

## 2014-02-19 NOTE — Progress Notes (Signed)
Acknowledged MD order regarding assisting mother with applying for medicaid.  A representative from patient accounts will contact mother regarding applying for medicaid.

## 2014-02-19 NOTE — Discharge Summary (Signed)
Antenatal Physician Discharge Summary  Patient ID: Yvonne Castillo MRN: [id number] DOB/AGE: 12/02/1995 18 y.o.  Admit date: 02/18/2014 Discharge date: 02/19/2014  Admission Diagnoses:  Patient Active Problem List   Diagnosis Date Noted  . Nephrolithiasis 02/18/2014   Pregnancy   Discharge Diagnoses: Same  Prenatal Procedures: none  Consults: Neonatology, Maternal Fetal Medicine  Significant Diagnostic Studies:  Results for orders placed during the hospital encounter of 02/18/14 (from the past 168 hour(s))  OB RESULTS CONSOLE HIV ANTIBODY (ROUTINE TESTING)   Collection Time    02/18/14 12:00 AM      Result Value Ref Range   HIV Non-reactive    HCG, QUANTITATIVE, PREGNANCY   Collection Time    02/18/14  2:59 AM      Result Value Ref Range   hCG, Beta Chain, Sharene ButtersQuant, [place] [id number] (*) <5 mIU/mL  CBC WITH DIFFERENTIAL   Collection Time    02/18/14  2:59 AM      Result Value Ref Range   WBC 12.3 (*) 4.0 - 10.5 K/uL   RBC 3.42 (*) 3.87 - 5.11 MIL/uL   Hemoglobin 7.8 (*) 12.0 - 15.0 g/dL   HCT 19.125.0 (*) 47.836.0 - 29.546.0 %   MCV 73.1 (*) 78.0 - 100.0 fL   MCH 22.8 (*) 26.0 - 34.0 pg   MCHC 31.2  30.0 - 36.0 g/dL   RDW 62.117.6 (*) 30.811.5 - 65.715.5 %   Platelets 274  150 - 400 K/uL   Neutrophils Relative % 77  43 - 77 %   Neutro Abs 9.5 (*) 1.7 - 7.7 K/uL   Lymphocytes Relative 17  12 - 46 %   Lymphs Abs 2.1  0.7 - 4.0 K/uL   Monocytes Relative 4  3 - 12 %   Monocytes Absolute 0.5  0.1 - 1.0 K/uL   Eosinophils Relative 2  0 - 5 %   Eosinophils Absolute 0.2  0.0 - 0.7 K/uL   Basophils Relative 0  0 - 1 %   Basophils Absolute 0.0  0.0 - 0.1 K/uL  COMPREHENSIVE METABOLIC PANEL   Collection Time    02/18/14  2:59 AM      Result Value Ref Range   Sodium 136 (*) 137 - 147 mEq/L   Potassium 3.4 (*) 3.7 - 5.3 mEq/L   Chloride 103  96 - 112 mEq/L   CO2 19  19 - 32 mEq/L   Glucose, Bld 96  70 - 99 mg/dL   BUN 5 (*) 6 - 23 mg/dL   Creatinine, Ser 8.460.52  0.50 - 1.10 mg/dL   Calcium 8.4  8.4 - 96.210.5  mg/dL   Total Protein 6.5  6.0 - 8.3 g/dL   Albumin 2.8 (*) 3.5 - 5.2 g/dL   AST 17  0 - 37 U/L   ALT 9  0 - 35 U/L   Alkaline Phosphatase 85  39 - 117 U/L   Total Bilirubin <0.2 (*) 0.3 - 1.2 mg/dL   GFR calc non Af Amer >90  >90 mL/min   GFR calc Af Amer >90  >90 mL/min  URINALYSIS, ROUTINE W REFLEX MICROSCOPIC   Collection Time    02/18/14  3:36 AM      Result Value Ref Range   Color, Urine YELLOW  YELLOW   APPearance CLOUDY (*) CLEAR   Specific Gravity, Urine 1.021  1.005 - 1.030   pH 6.5  5.0 - 8.0   Glucose, UA NEGATIVE  NEGATIVE mg/dL   Hgb urine dipstick MODERATE (*)  NEGATIVE   Bilirubin Urine NEGATIVE  NEGATIVE   Ketones, ur NEGATIVE  NEGATIVE mg/dL   Protein, ur NEGATIVE  NEGATIVE mg/dL   Urobilinogen, UA 0.2  0.0 - 1.0 mg/dL   Nitrite NEGATIVE  NEGATIVE   Leukocytes, UA SMALL (*) NEGATIVE  URINE MICROSCOPIC-ADD ON   Collection Time    02/18/14  3:36 AM      Result Value Ref Range   Squamous Epithelial / LPF RARE  RARE   WBC, UA 3-6  <3 WBC/hpf   RBC / HPF 7-10  <3 RBC/hpf   Bacteria, UA RARE  RARE  GC/CHLAMYDIA PROBE AMP   Collection Time    02/18/14 10:00 AM      Result Value Ref Range   CT Probe RNA NEGATIVE  NEGATIVE   GC Probe RNA NEGATIVE  NEGATIVE  HEPATITIS B SURFACE ANTIGEN   Collection Time    02/18/14 10:05 AM      Result Value Ref Range   Hepatitis B Surface Ag NEGATIVE  NEGATIVE  RUBELLA SCREEN   Collection Time    02/18/14 10:05 AM      Result Value Ref Range   Rubella 1.88 (*) <0.90 Index  RPR   Collection Time    02/18/14 10:05 AM      Result Value Ref Range   RPR NON REACTIVE  NON REACTIVE  RAPID HIV SCREEN ([facility])   Collection Time    02/18/14 10:05 AM      Result Value Ref Range   SUDS Rapid HIV Screen NON REACTIVE  NON REACTIVE  TYPE AND SCREEN   Collection Time    02/18/14 10:05 AM      Result Value Ref Range   ABO/RH(D) O POS     Antibody Screen NEG     Sample Expiration 02/21/2014    ABO/RH   Collection Time     02/18/14 10:05 AM      Result Value Ref Range   ABO/RH(D) O POS    URINE RAPID DRUG SCREEN (HOSP PERFORMED)   Collection Time    02/18/14 12:38 PM      Result Value Ref Range   Opiates POSITIVE (*) NONE DETECTED   Cocaine NONE DETECTED  NONE DETECTED   Benzodiazepines NONE DETECTED  NONE DETECTED   Amphetamines NONE DETECTED  NONE DETECTED   Tetrahydrocannabinol NONE DETECTED  NONE DETECTED   Barbiturates NONE DETECTED  NONE DETECTED    Treatments: IV hydration and analgesia: Dilaudid  Hospital Course:  This is a 18 y.o. G3P2 with IUP at [redacted]w[redacted]d admitted for pain control in setting of nephrolithiasis. She was given IV fluids, flomax and urine was strained. She was observed, fetal heart rate monitoring remained reassuring, and she had no signs/symptoms of preterm labor or other maternal-fetal concerns.  Her pain was diminished. She was tolerating po pain meds.  She was treated for acute headache and improved.  She was deemed stable for discharge to home with outpatient follow up.  Discharge Exam: BP 100/36  Pulse 87  Temp(Src) 98.5 F (36.9 C) (Oral)  Resp 20  Ht 5\' 3"  (1.6 m)  Wt 115 lb (52.164 kg)  BMI 20.38 kg/m2  SpO2 97%  LMP 08/25/2013 General appearance: alert, cooperative and appears stated age Back: symmetric, no curvature. ROM normal. No CVA tenderness. GI: soft, non-tender; bowel sounds normal; no masses,  no organomegaly  Discharge Condition: Stable  Disposition: 01-Home or Self Care  Discharge Orders   Future Orders Complete By Expires  Discharge activity:  No Restrictions  As directed    Discharge diet:  No restrictions  As directed    Fetal Kick Count:  Lie on our left side for one hour after a meal, and count the number of times your baby kicks.  If it is less than 5 times, get up, move around and drink some juice.  Repeat the test 30 minutes later.  If it is still less than 5 kicks in an hour, notify your doctor.  As directed    No sexual activity  restrictions  As directed        Medication List         HYDROmorphone 2 MG tablet  Commonly known as:  DILAUDID  Take 1 tablet (2 mg total) by mouth every 3 (three) hours as needed for moderate pain or severe pain.           Follow-up Information   Follow up with [facility]. Schedule an appointment as soon as possible for a visit in 1 week. (New OB visit--they will call with appointment)    Specialty:  Obstetrics and Gynecology   Contact information:   [address] [place] [address] [phone]      Signed: Reva Bores M.D. 02/19/2014, 8:59 PM

## 2014-02-19 NOTE — Progress Notes (Signed)
Patient ID: Yvonne Castillo, female   DOB: 09-Nov-1996, 18 y.o.   MRN: [id number] Pt. Without long h/o migraine headache but headache today and not relieved with po dilaudid.  Earlier in pregnancy, seen in Ed with headache for 1 wk and treated with IVF, Benadryl and Reglan.  Headache improved the next day.  Will give IV Decadron, Benadryl and compazine and see if that helps.

## 2014-02-19 NOTE — Discharge Instructions (Signed)
Kidney Stones  Kidney stones (urolithiasis) are deposits that form inside your kidneys. The intense pain is caused by the stone moving through the urinary tract. When the stone moves, the ureter goes into spasm around the stone. The stone is usually passed in the urine.   CAUSES   · A disorder that makes certain neck glands produce too much parathyroid hormone (primary hyperparathyroidism).  · A buildup of uric acid crystals, similar to gout in your joints.  · Narrowing (stricture) of the ureter.  · A kidney obstruction present at birth (congenital obstruction).  · Previous surgery on the kidney or ureters.  · Numerous kidney infections.  SYMPTOMS   · Feeling sick to your stomach (nauseous).  · Throwing up (vomiting).  · Blood in the urine (hematuria).  · Pain that usually spreads (radiates) to the groin.  · Frequency or urgency of urination.  DIAGNOSIS   · Taking a history and physical exam.  · Blood or urine tests.  · CT scan.  · Occasionally, an examination of the inside of the urinary bladder (cystoscopy) is performed.  TREATMENT   · Observation.  · Increasing your fluid intake.  · Extracorporeal shock wave lithotripsy This is a noninvasive procedure that uses shock waves to break up kidney stones.  · Surgery may be needed if you have severe pain or persistent obstruction. There are various surgical procedures. Most of the procedures are performed with the use of small instruments. Only small incisions are needed to accommodate these instruments, so recovery time is minimized.  The size, location, and chemical composition are all important variables that will determine the proper choice of action for you. Talk to your health care provider to better understand your situation so that you will minimize the risk of injury to yourself and your kidney.   HOME CARE INSTRUCTIONS   · Drink enough water and fluids to keep your urine clear or pale yellow. This will help you to pass the stone or stone fragments.  · Strain  all urine through the provided strainer. Keep all particulate matter and stones for your health care provider to see. The stone causing the pain may be as small as a grain of salt. It is very important to use the strainer each and every time you pass your urine. The collection of your stone will allow your health care provider to analyze it and verify that a stone has actually passed. The stone analysis will often identify what you can do to reduce the incidence of recurrences.  · Only take over-the-counter or prescription medicines for pain, discomfort, or fever as directed by your health care provider.  · Make a follow-up appointment with your health care provider as directed.  · Get follow-up X-rays if required. The absence of pain does not always mean that the stone has passed. It may have only stopped moving. If the urine remains completely obstructed, it can cause loss of kidney function or even complete destruction of the kidney. It is your responsibility to make sure X-rays and follow-ups are completed. Ultrasounds of the kidney can show blockages and the status of the kidney. Ultrasounds are not associated with any radiation and can be performed easily in a matter of minutes.  SEEK MEDICAL CARE IF:  · You experience pain that is progressive and unresponsive to any pain medicine you have been prescribed.  SEEK IMMEDIATE MEDICAL CARE IF:   · Pain cannot be controlled with the prescribed medicine.  · You have a fever   or shaking chills.  · The severity or intensity of pain increases over 18 hours and is not relieved by pain medicine.  · You develop a new onset of abdominal pain.  · You feel faint or pass out.  · You are unable to urinate.  MAKE SURE YOU:   · Understand these instructions.  · Will watch your condition.  · Will get help right away if you are not doing well or get worse.  Document Released: 11/04/2005 Document Revised: 07/07/2013 Document Reviewed: 04/07/2013  [employer]® Patient Information ©2014  [employer].

## 2014-02-19 NOTE — Progress Notes (Signed)
Patient ID: Yvonne Castillo, female   DOB: 29-Apr-1996, 18 y.o.   MRN: [id number] FACULTY PRACTICE ANTEPARTUM(COMPREHENSIVE) NOTE  Yvonne Castillo is a 18 y.o. G3P2 at 8535w4d by best clinical estimate who is admitted for pain management , nephrolithiasis, right sided..   Fetal presentation is unsure. Length of Stay:  1  Days  Subjective: The pain is controlled with IV meds, Pt interested in moving to po meds to see if home care an option. Will switch to p.o. Dilaudid. Patient reports the fetal movement as active. Patient reports uterine contraction  activity as none. Patient reports  vaginal bleeding as none. Patient describes fluid per vagina as None.  Vitals:  Blood pressure 92/47, pulse 89, temperature 98.5 F (36.9 C), temperature source Oral, resp. rate 20, height 5\' 3"  (1.6 m), weight 52.164 kg (115 lb), last menstrual period 08/25/2013, SpO2 97.00%. Physical Examination:  General appearance - alert, well appearing, and in no distress, oriented to person, place, and time, well hydrated and in mild to moderate distress Heart - normal rate and regular rhythm Abdomen - soft, nontender, nondistended Fundal Height:  size equals dates Cervical Exam: Not evaluated. A Extremities: extremities normal, atraumatic, no cyanosis or edema and Homans sign is negative, no sign of DVT with DTRs 2+ bilaterally Membranes:intact  Fetal Monitoring:  q shift.  Labs:  Results for orders placed during the hospital encounter of 02/18/14 (from the past 24 hour(s))  HEPATITIS B SURFACE ANTIGEN   Collection Time    02/18/14 10:05 AM      Result Value Ref Range   Hepatitis B Surface Ag NEGATIVE  NEGATIVE  RUBELLA SCREEN   Collection Time    02/18/14 10:05 AM      Result Value Ref Range   Rubella 1.88 (*) <0.90 Index  RPR   Collection Time    02/18/14 10:05 AM      Result Value Ref Range   RPR NON REACTIVE  NON REACTIVE  RAPID HIV SCREEN ([facility])   Collection Time    02/18/14 10:05 AM      Result Value  Ref Range   SUDS Rapid HIV Screen NON REACTIVE  NON REACTIVE  TYPE AND SCREEN   Collection Time    02/18/14 10:05 AM      Result Value Ref Range   ABO/RH(D) O POS     Antibody Screen NEG     Sample Expiration 02/21/2014    ABO/RH   Collection Time    02/18/14 10:05 AM      Result Value Ref Range   ABO/RH(D) O POS    URINE RAPID DRUG SCREEN (HOSP PERFORMED)   Collection Time    02/18/14 12:38 PM      Result Value Ref Range   Opiates POSITIVE (*) NONE DETECTED   Cocaine NONE DETECTED  NONE DETECTED   Benzodiazepines NONE DETECTED  NONE DETECTED   Amphetamines NONE DETECTED  NONE DETECTED   Tetrahydrocannabinol NONE DETECTED  NONE DETECTED   Barbiturates NONE DETECTED  NONE DETECTED    Imaging Studies:     Currently EPIC will not allow sonographic studies to automatically populate into notes.  In the meantime, copy and paste results into note or free text.  Medications:  Scheduled . docusate sodium  100 mg Oral Daily  . prenatal multivitamin  1 tablet Oral Q1200  . tamsulosin  0.4 mg Oral Daily   I have reviewed the patient's current medications.  ASSESSMENT: Patient Active Problem List   Diagnosis Date Noted  .  Nephrolithiasis 02/18/2014    PLAN: Reduce IVF to 125 /hr, switch to p.o . Analgesics, reassess in 4-6 hrs for possible outpt care  Sheena Simonis V 02/19/2014,7:52 AM

## 2014-02-21 NOTE — Progress Notes (Signed)
Post discharge ur review completed. 

## 2014-02-28 ENCOUNTER — Encounter (HOSPITAL_COMMUNITY): Payer: Self-pay

## 2014-02-28 ENCOUNTER — Encounter: Payer: Self-pay | Admitting: Family Medicine

## 2014-02-28 ENCOUNTER — Inpatient Hospital Stay (HOSPITAL_COMMUNITY)
Admission: AD | Admit: 2014-02-28 | Discharge: 2014-02-28 | Disposition: A | Discharge status: Home / Self Care | Payer: Medicaid Other | Source: Ambulatory Visit | Attending: Obstetrics & Gynecology | Admitting: Obstetrics & Gynecology

## 2014-02-28 DIAGNOSIS — J45909 Unspecified asthma, uncomplicated: Secondary | ICD-10-CM | POA: Insufficient documentation

## 2014-02-28 DIAGNOSIS — M79609 Pain in unspecified limb: Secondary | ICD-10-CM

## 2014-02-28 DIAGNOSIS — IMO0001 Reserved for inherently not codable concepts without codable children: Secondary | ICD-10-CM

## 2014-02-28 DIAGNOSIS — O99891 Other specified diseases and conditions complicating pregnancy: Secondary | ICD-10-CM | POA: Insufficient documentation

## 2014-02-28 DIAGNOSIS — O9989 Other specified diseases and conditions complicating pregnancy, childbirth and the puerperium: Principal | ICD-10-CM

## 2014-02-28 DIAGNOSIS — M791 Myalgia, unspecified site: Secondary | ICD-10-CM

## 2014-02-28 DIAGNOSIS — O99330 Smoking (tobacco) complicating pregnancy, unspecified trimester: Secondary | ICD-10-CM | POA: Insufficient documentation

## 2014-02-28 HISTORY — DX: Anemia, unspecified: D64.9

## 2014-02-28 LAB — COMPREHENSIVE METABOLIC PANEL
ALBUMIN: 2.9 g/dL — AB (ref 3.5–5.2)
ALT: 7 U/L (ref 0–35)
AST: 14 U/L (ref 0–37)
Alkaline Phosphatase: 94 U/L (ref 39–117)
BUN: 3 mg/dL — ABNORMAL LOW (ref 6–23)
CALCIUM: 8.5 mg/dL (ref 8.4–10.5)
CO2: 23 meq/L (ref 19–32)
CREATININE: 0.46 mg/dL — AB (ref 0.50–1.10)
Chloride: 103 mEq/L (ref 96–112)
GFR calc Af Amer: >90 mL/min (ref >90)
Glucose, Bld: 78 mg/dL (ref 70–99)
Potassium: 3.6 mEq/L — ABNORMAL LOW (ref 3.7–5.3)
SODIUM: 138 meq/L (ref 137–147)
Total Bilirubin: 0.3 mg/dL (ref 0.3–1.2)
Total Protein: 6.6 g/dL (ref 6.0–8.3)

## 2014-02-28 LAB — URINALYSIS, ROUTINE W REFLEX MICROSCOPIC
Bilirubin Urine: NEGATIVE
Glucose, UA: NEGATIVE mg/dL
Hgb urine dipstick: NEGATIVE
KETONES UR: NEGATIVE mg/dL
Leukocytes, UA: NEGATIVE
NITRITE: NEGATIVE
Protein, ur: NEGATIVE mg/dL
SPECIFIC GRAVITY, URINE: 1.01 (ref 1.005–1.030)
UROBILINOGEN UA: 0.2 mg/dL (ref 0.0–1.0)
pH: 6.5 (ref 5.0–8.0)

## 2014-02-28 LAB — CBC
HCT: 25.8 % — ABNORMAL LOW (ref 36.0–46.0)
Hemoglobin: 7.8 g/dL — ABNORMAL LOW (ref 12.0–15.0)
MCH: 22.3 pg — ABNORMAL LOW (ref 26.0–34.0)
MCHC: 30.2 g/dL (ref 30.0–36.0)
MCV: 73.9 fL — AB (ref 78.0–100.0)
PLATELETS: 262 10*3/uL (ref 150–400)
RBC: 3.49 MIL/uL — AB (ref 3.87–5.11)
RDW: 18.3 % — AB (ref 11.5–15.5)
WBC: 13.5 10*3/uL — ABNORMAL HIGH (ref 4.0–10.5)

## 2014-02-28 MED ORDER — CYCLOBENZAPRINE HCL 10 MG PO TABS: 10.0000 mg | ORAL_TABLET | Freq: Two times a day (BID) | ORAL | Status: DC | PRN

## 2014-02-28 MED ORDER — CYCLOBENZAPRINE HCL 10 MG PO TABS
10.0000 mg | ORAL_TABLET | Freq: Once | ORAL | Status: AC
  Administered 2014-02-28: 10 mg via ORAL
  Filled 2014-02-28: qty 1

## 2014-02-28 MED ORDER — IBUPROFEN 200 MG PO TABS: 800.0000 mg | ORAL_TABLET | Freq: Three times a day (TID) | ORAL | Status: DC | PRN

## 2014-02-28 MED ORDER — ACETAMINOPHEN 325 MG PO TABS
650.0000 mg | ORAL_TABLET | Freq: Once | ORAL | Status: AC
  Administered 2014-02-28: 650 mg via ORAL
  Filled 2014-02-28: qty 2

## 2014-02-28 NOTE — MAU Note (Signed)
[facility]

## 2014-02-28 NOTE — MAU Provider Note (Signed)
First Provider Initiated Contact with Patient 02/28/14 1245      Chief Complaint:  Leg Pain   Yvonne Castillo is  18 y.o. G3P2 at 9233w6d presents complaining of Leg Pain Pt [facility] for nephrolithiaisis.  She states no contractions, no vaginal bleeding,no lof and normal fetal movement.  Pt woke last night at 0230 and felt severe pain in the back of her legs R>L. Pt states it feels like sore muscles initially 7/10 improved to 3/10 with dilaudid pt had left over from her discharge. Pt states that she did not perform any exercise this weekend, no extended periods being outside, no periods of prolonged immobility. NO Headaches, vision changes, SOB, n/v, d/c. No change in urinary symptoms (no red or dark urine), no dysuria. Able to ambulate but with difficulty. Denies medications or other possible factors  Of note, pt had appt today, and missed that appointment 2/2 pain.  Obstetrical/Gynecological History: OB History   Grav Para Term Preterm Abortions TAB SAB Ect Mult Living   3 2        2      Past Medical History: Past Medical History  Diagnosis Date  . Asthma   . Preeclampsia in postpartum period   . Anemia     Past Surgical History: Past Surgical History  Procedure Laterality Date  . No past surgeries      Family History: Family History  Problem Relation Age of Onset  . Anemia Mother   . Depression Mother   . Asthma Brother   . Diabetes Maternal Grandmother   . Heart disease Paternal Grandfather     Social History: History  Substance Use Topics  . Smoking status: Current Every Day Smoker -- 0.25 packs/day    Types: Cigars, Cigarettes  . Smokeless tobacco: Not on file  . Alcohol Use: No    Allergies: No Known Allergies  Meds:  Prescriptions prior to admission  Medication Sig Dispense Refill  . HYDROmorphone (DILAUDID) 2 MG tablet Take 1 tablet (2 mg total) by mouth every 3 (three) hours as needed for moderate pain or severe pain.   30 tablet  0  . OVER THE COUNTER MEDICATION Take 10 mLs by mouth once. Pt took a liquid medication to help with heartburn. Did not know the name or strength.      . Prenatal Vit-Fe Fumarate-FA (PRENATAL MULTIVITAMIN) TABS tablet Take 1 tablet by mouth daily at 12 noon.        Review of Systems -   Review of Systems  Denies fevers, chills, nausea, vomiting, diarrhea, constipation, headaches, no vision changes, chest pain, shortness breath. Denies urinary or bowel symptoms. Resolution of pain from prior kidneys stone. Only posterior leg pain as above    Physical Exam  Blood pressure 115/51, pulse 93, temperature 98.9 F (37.2 C), temperature source Oral, resp. rate 16, height 5\' 2"  (1.575 m), weight 58.968 kg (130 lb), last menstrual period 08/25/2013, SpO2 99.00%. GENERAL: Well-developed, well-nourished female in no acute distress.  LUNGS: Clear to auscultation bilaterally.  HEART: Regular rate and rhythm. ABDOMEN: Soft, nontender, nondistended, gravid.  EXTREMITIES: Nontender, no edema, 2+ distal pulses. DTR's 2+ FHT:  Baseline rate 150s bpm   Variability moderate  Accelerations present   Decelerations none Contractions:  none  Labs: Results for orders placed during the hospital encounter of 02/28/14 (from the past 24 hour(s))  URINALYSIS, ROUTINE W REFLEX MICROSCOPIC   Collection Time    02/28/14 12:20 PM  Result Value Ref Range   Color, Urine YELLOW  YELLOW   APPearance CLEAR  CLEAR   Specific Gravity, Urine 1.010  1.005 - 1.030   pH 6.5  5.0 - 8.0   Glucose, UA NEGATIVE  NEGATIVE mg/dL   Hgb urine dipstick NEGATIVE  NEGATIVE   Bilirubin Urine NEGATIVE  NEGATIVE   Ketones, ur NEGATIVE  NEGATIVE mg/dL   Protein, ur NEGATIVE  NEGATIVE mg/dL   Urobilinogen, UA 0.2  0.0 - 1.0 mg/dL   Nitrite NEGATIVE  NEGATIVE   Leukocytes, UA NEGATIVE  NEGATIVE  CBC   Collection Time    02/28/14 12:44 PM      Result Value Ref Range   WBC 13.5 (*) 4.0 - 10.5 K/uL   RBC 3.49 (*) 3.87 -  5.11 MIL/uL   Hemoglobin 7.8 (*) 12.0 - 15.0 g/dL   HCT 57.8 (*) 46.9 - 62.9 %   MCV 73.9 (*) 78.0 - 100.0 fL   MCH 22.3 (*) 26.0 - 34.0 pg   MCHC 30.2  30.0 - 36.0 g/dL   RDW 52.8 (*) 41.3 - 24.4 %   Platelets 262  150 - 400 K/uL  COMPREHENSIVE METABOLIC PANEL   Collection Time    02/28/14 12:44 PM      Result Value Ref Range   Sodium 138  137 - 147 mEq/L   Potassium 3.6 (*) 3.7 - 5.3 mEq/L   Chloride 103  96 - 112 mEq/L   CO2 23  19 - 32 mEq/L   Glucose, Bld 78  70 - 99 mg/dL   BUN 3 (*) 6 - 23 mg/dL   Creatinine, Ser 0.10 (*) 0.50 - 1.10 mg/dL   Calcium 8.5  8.4 - 27.2 mg/dL   Total Protein 6.6  6.0 - 8.3 g/dL   Albumin 2.9 (*) 3.5 - 5.2 g/dL   AST 14  0 - 37 U/L   ALT 7  0 - 35 U/L   Alkaline Phosphatase 94  39 - 117 U/L   Total Bilirubin 0.3  0.3 - 1.2 mg/dL   GFR calc non Af Amer >90  >90 mL/min   GFR calc Af Amer >90  >90 mL/min   Imaging Studies:  US Ob Comp + 14 Wk  02/18/2014   CLINICAL DATA:  Right flank pain.  EXAM: LIMITED OBSTETRIC ULTRASOUND  FINDINGS: Number of Fetuses: 1  Heart Rate:  141 bpm  Movement: Yes  Presentation: Breech  Placental Location:  Posterior fundal  Previa: No  Amniotic Fluid (Subjective):  Within normal limits.  BPD:  6.84 cm 27 w 3 d  MATERNAL FINDINGS:  Cervix:  Appears closed, measuring 4.6 cm in length.  Uterus/Adnexae:  No abnormality visualized.  IMPRESSION: Single live intrauterine pregnancy noted, with a biparietal diameter of 6.8 cm, corresponding to a gestational age of [redacted] weeks 3 days. This reflects an estimated date of delivery of June 30th, 2015. No evidence of placenta previa; the cervix remains closed, measuring 4.6 cm in length.  This exam is performed on an emergent basis and does not comprehensively evaluate fetal size, dating, or anatomy; follow-up complete OB US should be considered if further fetal assessment is warranted.   Electronically Signed   By: Roanna Raider M.D.   On: 02/18/2014 06:43   MDM: Concern for possible  muscle breakdown eval with CBC and CMP. Isolated elevated AST evidence of muscle breakdown but unlikely in setting of normal urine.   Unlikely DVT given neg doppler and symetric legs. Discussed  with pharmacy and while Flomax does have a possibility of muscle soreness, given it was taken 7 days ago unlikely related. Compazine also may cause symptoms but unlikely.  After discussion of Yvonne Castillo agree with treatment for muscle soreness and close followup   Assessment: Yvonne Castillo is  18 y.o. G3P2 at 5015w6d presents with muscle soreness in her legs. Negative review of systems and negative DVT evaluation. It reassuring that there is none acute pathology. Unlikely to be myositis or rhabdomyolysis given normal CMET and urinalysis.  - Will treat with Motrin 800 milligrams 3 times a day for 3 days - Provide prescription for Flexeril - Follow up in clinic this week for reevaluation  Minta BalsamMichael R Romie Keeble 4/13/20154:10 PM

## 2014-02-28 NOTE — MAU Note (Signed)
Patient states she started having buttock aching that radiates down into the thighs. Difficult to sit due to feeling bruised. Denies bleeding or leaking of fluid and reports good fetal movement. Patient has not started prenatal care yet. [facility]

## 2014-02-28 NOTE — Discharge Instructions (Signed)
Second Trimester of Pregnancy The second trimester is from week 13 through week 28, months 4 through 6. The second trimester is often a time when you feel your best. Your body has also adjusted to being pregnant, and you begin to feel better physically. Usually, morning sickness has lessened or quit completely, you may have more energy, and you may have an increase in appetite. The second trimester is also a time when the fetus is growing rapidly. At the end of the sixth month, the fetus is about 9 inches long and weighs about 1 pounds. You will likely begin to feel the baby move (quickening) between 18 and 20 weeks of the pregnancy. BODY CHANGES Your body goes through many changes during pregnancy. The changes vary from woman to woman.   Your weight will continue to increase. You will notice your lower abdomen bulging out.  You may begin to get stretch marks on your hips, abdomen, and breasts.  You may develop headaches that can be relieved by medicines approved by your caregiver.  You may urinate more often because the fetus is pressing on your bladder.  You may develop or continue to have heartburn as a result of your pregnancy.  You may develop constipation because certain hormones are causing the muscles that push waste through your intestines to slow down.  You may develop hemorrhoids or swollen, bulging veins (varicose veins).  You may have back pain because of the weight gain and pregnancy hormones relaxing your joints between the bones in your pelvis and as a result of a shift in weight and the muscles that support your balance.  Your breasts will continue to grow and be tender.  Your gums may bleed and may be sensitive to brushing and flossing.  Dark spots or blotches (chloasma, mask of pregnancy) may develop on your face. This will likely fade after the baby is born.  A dark line from your belly button to the pubic area (linea nigra) may appear. This will likely fade after the  baby is born. WHAT TO EXPECT AT YOUR PRENATAL VISITS During a routine prenatal visit:  You will be weighed to make sure you and the fetus are growing normally.  Your blood pressure will be taken.  Your abdomen will be measured to track your baby's growth.  The fetal heartbeat will be listened to.  Any test results from the previous visit will be discussed. Your caregiver may ask you:  How you are feeling.  If you are feeling the baby move.  If you have had any abnormal symptoms, such as leaking fluid, bleeding, severe headaches, or abdominal cramping.  If you have any questions. Other tests that may be performed during your second trimester include:  Blood tests that check for:  Low iron levels (anemia).  Gestational diabetes (between 24 and 28 weeks).  Rh antibodies.  Urine tests to check for infections, diabetes, or protein in the urine.  An ultrasound to confirm the proper growth and development of the baby.  An amniocentesis to check for possible genetic problems.  Fetal screens for spina bifida and Down syndrome. HOME CARE INSTRUCTIONS   Avoid all smoking, herbs, alcohol, and unprescribed drugs. These chemicals affect the formation and growth of the baby.  Follow your caregiver's instructions regarding medicine use. There are medicines that are either safe or unsafe to take during pregnancy.  Exercise only as directed by your caregiver. Experiencing uterine cramps is a good sign to stop exercising.  Continue to eat regular,   healthy meals.  Wear a good support bra for breast tenderness.  Do not use hot tubs, steam rooms, or saunas.  Wear your seat belt at all times when driving.  Avoid raw meat, uncooked cheese, cat litter boxes, and soil used by cats. These carry germs that can cause birth defects in the baby.  Take your prenatal vitamins.  Try taking a stool softener (if your caregiver approves) if you develop constipation. Eat more high-fiber foods,  such as fresh vegetables or fruit and whole grains. Drink plenty of fluids to keep your urine clear or pale yellow.  Take warm sitz baths to soothe any pain or discomfort caused by hemorrhoids. Use hemorrhoid cream if your caregiver approves.  If you develop varicose veins, wear support hose. Elevate your feet for 15 minutes, 3 4 times a day. Limit salt in your diet.  Avoid heavy lifting, wear low heel shoes, and practice good posture.  Rest with your legs elevated if you have leg cramps or low back pain.  Visit your dentist if you have not gone yet during your pregnancy. Use a soft toothbrush to brush your teeth and be gentle when you floss.  A sexual relationship may be continued unless your caregiver directs you otherwise.  Continue to go to all your prenatal visits as directed by your caregiver. SEEK MEDICAL CARE IF:   You have dizziness.  You have mild pelvic cramps, pelvic pressure, or nagging pain in the abdominal area.  You have persistent nausea, vomiting, or diarrhea.  You have a bad smelling vaginal discharge.  You have pain with urination. SEEK IMMEDIATE MEDICAL CARE IF:   You have a fever.  You are leaking fluid from your vagina.  You have spotting or bleeding from your vagina.  You have severe abdominal cramping or pain.  You have rapid weight gain or loss.  You have shortness of breath with chest pain.  You notice sudden or extreme swelling of your face, hands, ankles, feet, or legs.  You have not felt your baby move in over an hour.  You have severe headaches that do not go away with medicine.  You have vision changes. Document Released: 10/29/2001 Document Revised: 07/07/2013 Document Reviewed: 01/05/2013 [employer] Patient Information 2014 [employer].  

## 2014-02-28 NOTE — Progress Notes (Signed)
*  PRELIMINARY RESULTS* Vascular Ultrasound Lower extremity venous duplex has been completed.  Preliminary findings: No evidence of DVT.   Glendale ChardJill I Eunice RVT 02/28/2014, 4:11 PM

## 2014-02-28 NOTE — MAU Provider Note (Signed)
Attestation of Attending Supervision of Fellow: Evaluation and management procedures were performed by the Fellow under my supervision and collaboration.  I have reviewed the Fellow's note and chart, I have also examined the patient and I agree with the management and plan.

## 2014-03-16 ENCOUNTER — Ambulatory Visit: Payer: Self-pay | Admitting: Obstetrics & Gynecology

## 2014-03-17 ENCOUNTER — Encounter: Payer: Self-pay | Admitting: Nurse Practitioner

## 2014-03-20 ENCOUNTER — Inpatient Hospital Stay (HOSPITAL_COMMUNITY)
Admission: AD | Admit: 2014-03-20 | Discharge: 2014-03-21 | Disposition: A | Discharge status: Home / Self Care | Payer: Medicaid Other | Source: Ambulatory Visit | Attending: Obstetrics & Gynecology | Admitting: Obstetrics & Gynecology

## 2014-03-20 DIAGNOSIS — J45909 Unspecified asthma, uncomplicated: Secondary | ICD-10-CM | POA: Insufficient documentation

## 2014-03-20 DIAGNOSIS — Z87891 Personal history of nicotine dependence: Secondary | ICD-10-CM | POA: Insufficient documentation

## 2014-03-20 DIAGNOSIS — O99891 Other specified diseases and conditions complicating pregnancy: Secondary | ICD-10-CM | POA: Insufficient documentation

## 2014-03-20 DIAGNOSIS — J45901 Unspecified asthma with (acute) exacerbation: Secondary | ICD-10-CM

## 2014-03-20 DIAGNOSIS — O9989 Other specified diseases and conditions complicating pregnancy, childbirth and the puerperium: Principal | ICD-10-CM

## 2014-03-21 ENCOUNTER — Inpatient Hospital Stay (EMERGENCY_DEPARTMENT_HOSPITAL)
Admission: AD | Admit: 2014-03-21 | Discharge: 2014-03-21 | Disposition: A | Discharge status: Home / Self Care | Payer: Medicaid Other | Source: Ambulatory Visit | Attending: Obstetrics & Gynecology | Admitting: Obstetrics & Gynecology

## 2014-03-21 ENCOUNTER — Encounter (HOSPITAL_COMMUNITY): Payer: Self-pay

## 2014-03-21 ENCOUNTER — Inpatient Hospital Stay (HOSPITAL_COMMUNITY): Payer: Medicaid Other

## 2014-03-21 ENCOUNTER — Encounter (HOSPITAL_COMMUNITY): Payer: Self-pay | Admitting: *Deleted

## 2014-03-21 DIAGNOSIS — N200 Calculus of kidney: Secondary | ICD-10-CM

## 2014-03-21 DIAGNOSIS — O9989 Other specified diseases and conditions complicating pregnancy, childbirth and the puerperium: Secondary | ICD-10-CM

## 2014-03-21 DIAGNOSIS — O99330 Smoking (tobacco) complicating pregnancy, unspecified trimester: Secondary | ICD-10-CM | POA: Insufficient documentation

## 2014-03-21 DIAGNOSIS — O99891 Other specified diseases and conditions complicating pregnancy: Secondary | ICD-10-CM | POA: Insufficient documentation

## 2014-03-21 DIAGNOSIS — J45909 Unspecified asthma, uncomplicated: Secondary | ICD-10-CM

## 2014-03-21 DIAGNOSIS — J40 Bronchitis, not specified as acute or chronic: Secondary | ICD-10-CM

## 2014-03-21 DIAGNOSIS — J45901 Unspecified asthma with (acute) exacerbation: Secondary | ICD-10-CM

## 2014-03-21 DIAGNOSIS — R0602 Shortness of breath: Secondary | ICD-10-CM | POA: Diagnosis present

## 2014-03-21 DIAGNOSIS — Z87891 Personal history of nicotine dependence: Secondary | ICD-10-CM | POA: Diagnosis not present

## 2014-03-21 MED ORDER — BUDESONIDE 90 MCG/ACT IN AEPB: 1.0000 | INHALATION_SPRAY | Freq: Two times a day (BID) | RESPIRATORY_TRACT | Status: DC

## 2014-03-21 MED ORDER — ALBUTEROL SULFATE HFA 108 (90 BASE) MCG/ACT IN AERS: 2.0000 | INHALATION_SPRAY | Freq: Four times a day (QID) | RESPIRATORY_TRACT | Status: DC | PRN

## 2014-03-21 MED ORDER — ALBUTEROL SULFATE (2.5 MG/3ML) 0.083% IN NEBU
2.5000 mg | INHALATION_SOLUTION | RESPIRATORY_TRACT | Status: AC
  Administered 2014-03-21: 2.5 mg via RESPIRATORY_TRACT
  Filled 2014-03-21: qty 3

## 2014-03-21 MED ORDER — AZITHROMYCIN 250 MG PO TABS
500.0000 mg | ORAL_TABLET | Freq: Once | ORAL | Status: AC
  Administered 2014-03-21: 500 mg via ORAL
  Filled 2014-03-21: qty 2

## 2014-03-21 MED ORDER — IPRATROPIUM-ALBUTEROL 0.5-2.5 (3) MG/3ML IN SOLN
3.0000 mL | RESPIRATORY_TRACT | Status: AC
  Administered 2014-03-21 (×2): 3 mL via RESPIRATORY_TRACT
  Filled 2014-03-21 (×2): qty 3

## 2014-03-21 MED ORDER — IPRATROPIUM BROMIDE 0.02 % IN SOLN
0.5000 mg | RESPIRATORY_TRACT | Status: AC
  Administered 2014-03-21: 0.5 mg via RESPIRATORY_TRACT
  Filled 2014-03-21: qty 2.5

## 2014-03-21 MED ORDER — ALBUTEROL SULFATE HFA 108 (90 BASE) MCG/ACT IN AERS
2.0000 | INHALATION_SPRAY | RESPIRATORY_TRACT | Status: DC | PRN
  Administered 2014-03-21: 2 via RESPIRATORY_TRACT
  Filled 2014-03-21: qty 6.7

## 2014-03-21 MED ORDER — IPRATROPIUM BROMIDE 0.02 % IN SOLN
0.5000 mg | Freq: Once | RESPIRATORY_TRACT | Status: AC
  Administered 2014-03-21: 0.5 mg via RESPIRATORY_TRACT
  Filled 2014-03-21: qty 2.5

## 2014-03-21 MED ORDER — PREDNISONE 20 MG PO TABS
40.0000 mg | ORAL_TABLET | Freq: Every day | ORAL | Status: DC
  Administered 2014-03-21: 40 mg via ORAL
  Filled 2014-03-21: qty 2

## 2014-03-21 MED ORDER — ALBUTEROL SULFATE (2.5 MG/3ML) 0.083% IN NEBU
2.5000 mg | INHALATION_SOLUTION | Freq: Once | RESPIRATORY_TRACT | Status: AC
  Administered 2014-03-21: 2.5 mg via RESPIRATORY_TRACT
  Filled 2014-03-21: qty 3

## 2014-03-21 MED ORDER — FLUTICASONE PROPIONATE HFA 44 MCG/ACT IN AERO
2.0000 | INHALATION_SPRAY | Freq: Two times a day (BID) | RESPIRATORY_TRACT | Status: DC
  Administered 2014-03-21: 2 via RESPIRATORY_TRACT
  Filled 2014-03-21: qty 10.6

## 2014-03-21 MED ORDER — PREDNISONE 20 MG PO TABS
40.0000 mg | ORAL_TABLET | Freq: Every day | ORAL | Status: DC
Start: 2014-03-21 — End: 2014-05-06

## 2014-03-21 MED ORDER — AZITHROMYCIN 250 MG PO TABS: ORAL_TABLET | ORAL | Status: DC

## 2014-03-21 MED ORDER — PREDNISONE 20 MG PO TABS
40.0000 mg | ORAL_TABLET | Freq: Once | ORAL | Status: AC
  Administered 2014-03-21: 40 mg via ORAL
  Filled 2014-03-21: qty 2

## 2014-03-21 NOTE — MAU Provider Note (Signed)
Chief Complaint:  No chief complaint on file.   First Provider Initiated Contact with Patient 03/21/14 1630      HPI: Yvonne Castillo is a 18 y.o. [id number] at 5147w6d who presents to maternity admissions reporting shortness of breath and possible asthma exacerbation.  She reports cough and nasal symptoms with onset 2 weeks ago.  She reports mild shortness of breath x1 week, which she first attributed to her respiratory infection.  Then, the shortness of breath worsened until yesterday when she was seen in MAU and given breathing treatment and prescriptions to manage her asthma.  She was unable to afford these medications so she has not picked them up.  She continues to have worsening symptoms so she returned today.  She cannot take a deep breath and feels like she can't catch her breath.  She has not had prenatal care in this pregnancy and [facility] but when checking in today she was told it is canceled in the computer.  She reports good fetal movement, denies LOF, vaginal bleeding, vaginal itching/burning, urinary symptoms, h/a, dizziness, n/v, or fever/chills.     Past Medical History: Past Medical History  Diagnosis Date  . Asthma   . Preeclampsia in postpartum period   . Anemia   . Pregnancy induced hypertension 2011    post partum    Past obstetric history: OB History  Gravida Para Term Preterm AB SAB TAB Ectopic Multiple Living  3 2 2       2     # Outcome Date GA Lbr Len/2nd Weight Sex Delivery Anes PTL Lv  3 CUR           2 TRM 2014     SVD   Y  1 TRM 2011     SVD   Y      Past Surgical History: Past Surgical History  Procedure Laterality Date  . No past surgeries      Family History: Family History  Problem Relation Age of Onset  . Anemia Mother   . Depression Mother   . Asthma Brother   . Diabetes Maternal Grandmother   . Heart disease Paternal Grandfather     Social History: History  Substance Use Topics  . Smoking status: Current  Some Day Smoker -- 0.25 packs/day    Types: Cigars, Cigarettes  . Smokeless tobacco: Not on file  . Alcohol Use: No    Allergies: No Known Allergies  Meds:  Prescriptions prior to admission  Medication Sig Dispense Refill  . Prenatal Vit-Fe Fumarate-FA (PRENATAL MULTIVITAMIN) TABS tablet Take 1 tablet by mouth daily at 12 noon.      Marland Kitchen. albuterol (PROVENTIL HFA;VENTOLIN HFA) 108 (90 BASE) MCG/ACT inhaler Inhale 2 puffs into the lungs every 6 (six) hours as needed for wheezing or shortness of breath.  1 Inhaler  2  . Budesonide 90 MCG/ACT inhaler Inhale 1 puff into the lungs 2 (two) times daily.  1 Inhaler  2  . predniSONE (DELTASONE) 20 MG tablet Take 2 tablets (40 mg total) by mouth daily with breakfast.  8 tablet  0    ROS: Pertinent findings in history of present illness.  Physical Exam  Blood pressure 114/68, pulse 116, resp. rate 26, last menstrual period 08/25/2013, SpO2 99.00%. GENERAL: Well-developed, well-nourished female in no acute distress.  HEENT: normocephalic HEART: mild tachycardia, rhythm, heart sounds RESP: lungs with inspiratory and expiratory wheezing, diminished bases rales throughout, more in right mid/upper lobes.  After breathing  treatment, wheezing improved but rales persist.  ABDOMEN: Soft, non-tender, gravid appropriate for gestational age EXTREMITIES: Nontender, no edema NEURO: alert and oriented    FHT:  Baseline 135 , moderate variability, accelerations present, no decelerations Contractions: None on toco or to palpation  MAU management: Preventil/Atrovent nebulizer treatment Chest X-Ray  Report to Rulon AbideKeli Nakoa Ganus, MD    Medication List    ASK your doctor about these medications       albuterol 108 (90 BASE) MCG/ACT inhaler  Commonly known as:  PROVENTIL HFA;VENTOLIN HFA  Inhale 2 puffs into the lungs every 6 (six) hours as needed for wheezing or shortness of breath.     Budesonide 90 MCG/ACT inhaler  Inhale 1 puff into the lungs 2 (two) times  daily.     predniSONE 20 MG tablet  Commonly known as:  DELTASONE  Take 2 tablets (40 mg total) by mouth daily with breakfast.     prenatal multivitamin Tabs tablet  Take 1 tablet by mouth daily at 12 noon.        Sharen CounterLisa Leftwich-Kirby Certified Nurse-Midwife 03/21/2014 4:51 PM   CXR negative for PNA but on exam still some rhonchi in RUL that did not clear with coughing.  Markedly improved breath sounds after nebulizer. Pt admits to not filling any of her medicines and this is likely the reason for returning. Given albuterol and flovent inhalers in the MAU and then able to take them home.  She was also instructed to fill an rx for prednisone and azithromycin (first dose of both given here). She believes she can fill both.  Sats 100% at time of discharge. F/u if no improvement or worsening. Urged to set up care downstairs and message sent to clinic to scheduled appt.  FWB- cat I tracing.    Vale HavenKeli L Quinzell Malcomb, MD

## 2014-03-21 NOTE — MAU Provider Note (Signed)
Attestation of Attending Supervision of Fellow: Evaluation and management procedures were performed by the Fellow under my supervision and collaboration.  I have reviewed the Fellow's note and chart, and I agree with the management and plan.    

## 2014-03-21 NOTE — MAU Provider Note (Signed)
None     Chief Complaint:  Shortness of Breath and Asthma   Yvonne Castillo is  18 y.o. [id number] at 6984w6d presents complaining of Shortness of Breath and Asthma Pt with hx of childhood asthma presents with 3d hx of worsening shortness of breath with associated coughing. Pt has not used inhaler (last exacerbation age 89).   No F/C, n/v, d/c, weaness, or chestpain. No other complaints at this time. No urinary symptoms or bowel symptoms.  Normal fetal movement, no LOF, no vb, no ctx  Obstetrical/Gynecological History: OB History   Grav Para Term Preterm Abortions TAB SAB Ect Mult Living   3 2 2       2      Past Medical History: Past Medical History  Diagnosis Date  . Asthma   . Preeclampsia in postpartum period   . Anemia   . Pregnancy induced hypertension 2011    post partum    Past Surgical History: Past Surgical History  Procedure Laterality Date  . No past surgeries      Family History: Family History  Problem Relation Age of Onset  . Anemia Mother   . Depression Mother   . Asthma Brother   . Diabetes Maternal Grandmother   . Heart disease Paternal Grandfather     Social History: History  Substance Use Topics  . Smoking status: Current Every Day Smoker -- 0.25 packs/day    Types: Cigars, Cigarettes  . Smokeless tobacco: Not on file  . Alcohol Use: No    Allergies: No Known Allergies  Meds:  Prescriptions prior to admission  Medication Sig Dispense Refill  . cyclobenzaprine (FLEXERIL) 10 MG tablet Take 1 tablet (10 mg total) by mouth 2 (two) times daily as needed for muscle spasms.  20 tablet  0  . HYDROmorphone (DILAUDID) 2 MG tablet Take 1 tablet (2 mg total) by mouth every 3 (three) hours as needed for moderate pain or severe pain.  30 tablet  0  . Prenatal Vit-Fe Fumarate-FA (PRENATAL MULTIVITAMIN) TABS tablet Take 1 tablet by mouth daily at 12 noon.        Review of Systems -   Review of Systems  As above.  Physical Exam  Blood pressure 97/48,  pulse 98, temperature 99 F (37.2 C), temperature source Oral, resp. rate 20, height 5\' 3"  (1.6 m), weight 61.871 kg (136 lb 6.4 oz), last menstrual period 08/25/2013, SpO2 97.00%. GENERAL: Well-developed, well-nourished female in no acute distress.  LUNGS: diffuse wheezing through all lung fields. HEART: Regular rate and rhythm. ABDOMEN: Soft, nontender, nondistended, gravid. No CVAT EXTREMITIES: Nontender, no edema, 2+ distal pulses. DTR's 2+  FHT:  Baseline rate 140s bpm   Variability moderate  Accelerations present   Decelerations none Contractions: none   Labs: No results found for this or any previous visit (from the past 24 hour(s)). Imaging Studies:  No results found.  MDM: Pt with likely asthma exacerbation caused by cold. - duonebs x3 - prednisone 40mg  now and 5day burst - satting well on room air, will monitor for improvement.  Rec'd duonebx3, symptomatically improved - persistent wheezing through lung fields. - reevaluate in 30-45min - fetus reassuring - evaluate with peak flow, symptoms likely last 1-2 days.  Assessment: Yvonne Castillo is  18 y.o. [id number] at 9084w6d presents with asthma exacerbation. Pt significantly improved with tx. Will discharge with: 5d course of prednisone 40mg  Albuterol  Start budesonide F/u in clinic Reassuring fetus.  Yvonne ScaleMichael Ryan Elice Crigger, MD OB Fellow

## 2014-03-21 NOTE — MAU Note (Signed)
Breathing treatment started.

## 2014-03-21 NOTE — MAU Note (Signed)
Difficulty breathing with nonproductive cough x3 days, no fever. SOB worse today with wheezing. Asthma as a child. Denies abdominal pain, vaginal bleeding, leaking of fluid. Positive fetal movement.

## 2014-03-21 NOTE — Discharge Instructions (Signed)

## 2014-03-21 NOTE — MAU Note (Signed)
Pt began with chest tightening at 1200, it progressed to wheezing and SOB. She was given a RX for inhaler and steroid yesterday but didn't get it filled because medicaid is pending and she did not have money for it.

## 2014-03-21 NOTE — Discharge Instructions (Signed)
Asthma, Acute Bronchospasm °Acute bronchospasm caused by asthma is also referred to as an asthma attack. Bronchospasm means your air passages become narrowed. The narrowing is caused by inflammation and tightening of the muscles in the air tubes (bronchi) in your lungs. This can make it hard to breath or cause you to wheeze and cough. °CAUSES °Possible triggers are: °· Animal dander from the skin, hair, or feathers of animals. °· Dust mites contained in house dust. °· Cockroaches. °· Pollen from trees or grass. °· Mold. °· Cigarette or tobacco smoke. °· Air pollutants such as dust, household cleaners, hair sprays, aerosol sprays, paint fumes, strong chemicals, or strong odors. °· Cold air or weather changes. Cold air may trigger inflammation. Winds increase molds and pollens in the air. °· Strong emotions such as crying or laughing hard. °· Stress. °· Certain medicines such as aspirin or beta-blockers. °· Sulfites in foods and drinks, such as dried fruits and wine. °· Infections or inflammatory conditions, such as a flu, cold, or inflammation of the nasal membranes (rhinitis). °· Gastroesophageal reflux disease (GERD). GERD is a condition where stomach acid backs up into your throat (esophagus). °· Exercise or strenuous activity. °SIGNS AND SYMPTOMS  °· Wheezing. °· Excessive coughing, particularly at night. °· Chest tightness. °· Shortness of breath. °DIAGNOSIS  °Your health care provider will ask you about your medical history and perform a physical exam. A chest X-ray or blood testing may be performed to look for other causes of your symptoms or other conditions that may have triggered your asthma attack.  °TREATMENT  °Treatment is aimed at reducing inflammation and opening up the airways in your lungs.  Most asthma attacks are treated with inhaled medicines. These include quick relief or rescue medicines (such as bronchodilators) and controller medicines (such as inhaled corticosteroids). These medicines are  sometimes given through an inhaler or a nebulizer. Systemic steroid medicine taken by mouth or given through an IV tube also can be used to reduce the inflammation when an attack is moderate or severe. Antibiotic medicines are only used if a bacterial infection is present.  °HOME CARE INSTRUCTIONS  °· Rest. °· Drink plenty of liquids. This helps the mucus to remain thin and be easily coughed up. Only use caffeine in moderation and do not use alcohol until you have recovered from your illness. °· Do not smoke. Avoid being exposed to secondhand smoke. °· You play a critical role in keeping yourself in good health. Avoid exposure to things that cause you to wheeze or to have breathing problems. °· Keep your medicines up to date and available. Carefully follow your health care provider's treatment plan. °· Take your medicine exactly as prescribed. °· When pollen or pollution is bad, keep windows closed and use an air conditioner or go to places with air conditioning. °· Asthma requires careful medical care. See your health care provider for a follow-up as advised. If you are more than [redacted] weeks pregnant and you were prescribed any new medicines, let your obstetrician know about the visit and how you are doing. Follow-up with your health care provider as directed. °· After you have recovered from your asthma attack, make an appointment with your outpatient doctor to talk about ways to reduce the likelihood of future attacks. If you do not have a doctor who manages your asthma, make an appointment with a primary care doctor to discuss your asthma. °SEEK IMMEDIATE MEDICAL CARE IF:  °· You are getting worse. °· You have trouble breathing. If severe, call   your local emergency services (911 in the [place] °· You develop chest pain or discomfort. °· You are vomiting. °· You are not able to keep fluids down. °· You are coughing up yellow, green, brown, or bloody sputum. °· You have a fever and your symptoms suddenly get  worse. °· You have trouble swallowing. °MAKE SURE YOU:  °· Understand these instructions. °· Will watch your condition. °· Will get help right away if you are not doing well or get worse. °Document Released: 02/19/2007 Document Revised: 07/07/2013 Document Reviewed: 05/12/2013 °Exit[employer]® Patient Information ©2014 [employer]. ° °

## 2014-03-24 ENCOUNTER — Encounter: Payer: Self-pay | Admitting: Obstetrics & Gynecology

## 2014-03-24 ENCOUNTER — Ambulatory Visit (INDEPENDENT_AMBULATORY_CARE_PROVIDER_SITE_OTHER): Payer: Medicaid Other | Admitting: Family

## 2014-03-24 ENCOUNTER — Encounter: Payer: Self-pay | Admitting: Family

## 2014-03-24 VITALS — BP 113/71 | HR 102 | Temp 98.8°F | Wt 136.0 lb

## 2014-03-24 DIAGNOSIS — Z3480 Encounter for supervision of other normal pregnancy, unspecified trimester: Secondary | ICD-10-CM

## 2014-03-24 DIAGNOSIS — O0930 Supervision of pregnancy with insufficient antenatal care, unspecified trimester: Secondary | ICD-10-CM

## 2014-03-24 DIAGNOSIS — D649 Anemia, unspecified: Secondary | ICD-10-CM

## 2014-03-24 DIAGNOSIS — O99019 Anemia complicating pregnancy, unspecified trimester: Secondary | ICD-10-CM

## 2014-03-24 DIAGNOSIS — Z3490 Encounter for supervision of normal pregnancy, unspecified, unspecified trimester: Secondary | ICD-10-CM

## 2014-03-24 DIAGNOSIS — O09299 Supervision of pregnancy with other poor reproductive or obstetric history, unspecified trimester: Secondary | ICD-10-CM

## 2014-03-24 DIAGNOSIS — Z23 Encounter for immunization: Secondary | ICD-10-CM

## 2014-03-24 LAB — POCT URINALYSIS DIP (DEVICE)
BILIRUBIN URINE: NEGATIVE
Glucose, UA: NEGATIVE mg/dL
KETONES UR: NEGATIVE mg/dL
Nitrite: NEGATIVE
Protein, ur: 30 mg/dL — AB
SPECIFIC GRAVITY, URINE: 1.015 (ref 1.005–1.030)
UROBILINOGEN UA: 0.2 mg/dL (ref 0.0–1.0)
pH: 7 (ref 5.0–8.0)

## 2014-03-24 LAB — OB RESULTS CONSOLE GC/CHLAMYDIA
Chlamydia: NEGATIVE
Gonorrhea: NEGATIVE

## 2014-03-24 MED ORDER — INTEGRA F 125-1 MG PO CAPS: 1.0000 | ORAL_CAPSULE | Freq: Every day | ORAL | Status: DC

## 2014-03-24 MED ORDER — TETANUS-DIPHTH-ACELL PERTUSSIS 5-2.5-18.5 LF-MCG/0.5 IM SUSP: 0.5000 mL | Freq: Once | INTRAMUSCULAR | Status: DC

## 2014-03-24 NOTE — Progress Notes (Signed)
U/S scheduled 03/31/14 at 1030 am.

## 2014-03-24 NOTE — Progress Notes (Signed)
New ob packet given Declined the flu vaccine Tdap vaccine consented Weight gain of 25-35lbs

## 2014-03-25 ENCOUNTER — Encounter: Payer: Self-pay | Admitting: Family

## 2014-03-25 DIAGNOSIS — O09299 Supervision of pregnancy with other poor reproductive or obstetric history, unspecified trimester: Secondary | ICD-10-CM | POA: Insufficient documentation

## 2014-03-25 LAB — OBSTETRIC PANEL
ANTIBODY SCREEN: NEGATIVE
Basophils Absolute: 0 10*3/uL (ref 0.0–0.1)
Basophils Relative: 0 % (ref 0–1)
Eosinophils Absolute: 0.6 10*3/uL (ref 0.0–0.7)
Eosinophils Relative: 6 % — ABNORMAL HIGH (ref 0–5)
HEMATOCRIT: 22.8 % — AB (ref 36.0–46.0)
HEMOGLOBIN: 7.3 g/dL — AB (ref 12.0–15.0)
Hepatitis B Surface Ag: NEGATIVE
LYMPHS PCT: 16 % (ref 12–46)
Lymphs Abs: 1.7 10*3/uL (ref 0.7–4.0)
MCH: 22.1 pg — ABNORMAL LOW (ref 26.0–34.0)
MCHC: 32 g/dL (ref 30.0–36.0)
MCV: 68.9 fL — AB (ref 78.0–100.0)
MONO ABS: 0.6 10*3/uL (ref 0.1–1.0)
MONOS PCT: 6 % (ref 3–12)
NEUTROS ABS: 7.5 10*3/uL (ref 1.7–7.7)
Neutrophils Relative %: 72 % (ref 43–77)
Platelets: 300 10*3/uL (ref 150–400)
RBC: 3.31 MIL/uL — ABNORMAL LOW (ref 3.87–5.11)
RDW: 19.4 % — ABNORMAL HIGH (ref 11.5–15.5)
RH TYPE: POSITIVE
RUBELLA: 1.36 {index} — AB (ref <0.90)
WBC: 10.4 10*3/uL (ref 4.0–10.5)

## 2014-03-25 LAB — GLUCOSE TOLERANCE, 1 HOUR (50G) W/O FASTING: Glucose, 1 Hour GTT: 100 mg/dL (ref 70–140)

## 2014-03-25 LAB — GC/CHLAMYDIA PROBE AMP
CT Probe RNA: NEGATIVE
GC PROBE AMP APTIMA: NEGATIVE

## 2014-03-25 LAB — HIV ANTIBODY (ROUTINE TESTING W REFLEX): HIV 1&2 Ab, 4th Generation: NONREACTIVE

## 2014-03-25 NOTE — Progress Notes (Signed)
   Subjective:    Yvonne Castillo is a G3P2002 559w3d being seen today for her first obstetrical visit.  Her obstetrical history is significant for pre-eclampsia and late prenatal care.. Pt hospitalized during postpartum period of 1st pregnancy for severe preeclampsia, given magnesium sulfate.  Pt also hospitalized this pregnancy for kidney stones.  Patient does intend to breast feed. Denies fatigue, shortness of breath, or palpitations despite low hgb.  Pregnancy history fully reviewed.  Patient reports no bleeding and no contractions.  Filed Vitals:   03/24/14 1119  BP: 113/71  Pulse: 102  Temp: 98.8 F (37.1 C)  Weight: 136 lb (61.689 kg)    HISTORY: OB History  Gravida Para Term Preterm AB SAB TAB Ectopic Multiple Living  3 2 2       2     # Outcome Date GA Lbr Len/2nd Weight Sex Delivery Anes PTL Lv  3 CUR           2 TRM 05/18/13 4236w0d  8 lb (3.629 kg) F SVD EPI  Y     Comments: PPH; retained placenta  1 TRM 03/13/10 7539w0d  7 lb 12 oz (3.515 kg) F SVD EPI  Y     Comments: Postpartume Prex; hospitalized on Mag, elevated liver enzymes, severe range pressures.     Past Medical History  Diagnosis Date  . Asthma   . Preeclampsia in postpartum period   . Anemia   . Pregnancy induced hypertension 2011    post partum   Past Surgical History  Procedure Laterality Date  . No past surgeries     Family History  Problem Relation Age of Onset  . Anemia Mother   . Depression Mother   . Asthma Brother   . Diabetes Maternal Grandmother   . Heart disease Paternal Grandfather      Exam   BP 113/71  Pulse 102  Temp(Src) 98.8 F (37.1 C)  Wt 136 lb (61.689 kg)  LMP 08/25/2013 System: Breast:  No nipple retraction or dimpling, No nipple discharge or bleeding, No axillary or supraclavicular adenopathy, Normal to palpation without dominant masses   Skin: normal coloration and turgor, no rashes    Neurologic: negative   Extremities: normal strength, tone, and muscle mass   HEENT neck supple with midline trachea and thyroid without masses   Mouth/Teeth mucous membranes moist, pharynx normal without lesions   Neck supple and no masses   Cardiovascular: regular rate and rhythm, no murmurs or gallops   Respiratory:  appears well, vitals normal, no respiratory distress, acyanotic, normal RR, neck free of mass or lymphadenopathy, chest clear, no wheezing, crepitations, rhonchi, normal symmetric air entry   Abdomen: soft, non-tender; bowel sounds normal; no masses,  no organomegaly   Urinary: urethral meatus normal        Assessment:    Pregnancy:   [age] yo G3P2002 at 669w3d wks IUP History of Postpartum Preeclampsia Hx of PPH d/t Retained Placenta  Patient Active Problem List   Diagnosis Date Noted  . Supervision of normal pregnancy 03/24/2014  . Anemia during pregnancy 03/24/2014  . Asthma 03/21/2014  . Nephrolithiasis 02/18/2014        Plan:     Initial labs drawn. Prenatal vitamins. RX Integra Problem list reviewed and updated. Genetic Screening:  Too Late  Ultrasound discussed; fetal survey: ordered.  Follow up in 2 weeks.  Eino FarberWalidah Paul HalfN Muhammad 03/25/2014

## 2014-03-28 LAB — HEMOGLOBINOPATHY EVALUATION
HEMOGLOBIN OTHER: 0 %
HGB A2 QUANT: 2.2 % (ref 2.2–3.2)
Hgb A: 97.8 % (ref 96.8–97.8)
Hgb F Quant: 0 % (ref 0.0–2.0)
Hgb S Quant: 0 %

## 2014-03-31 ENCOUNTER — Other Ambulatory Visit: Payer: Self-pay | Admitting: Family

## 2014-03-31 ENCOUNTER — Ambulatory Visit (HOSPITAL_COMMUNITY)
Admission: RE | Admit: 2014-03-31 | Discharge: 2014-03-31 | Disposition: A | Discharge status: Home / Self Care | Payer: Medicaid Other | Source: Ambulatory Visit | Attending: Family | Admitting: Family

## 2014-03-31 DIAGNOSIS — O0930 Supervision of pregnancy with insufficient antenatal care, unspecified trimester: Secondary | ICD-10-CM

## 2014-03-31 DIAGNOSIS — Z3689 Encounter for other specified antenatal screening: Secondary | ICD-10-CM | POA: Insufficient documentation

## 2014-04-04 ENCOUNTER — Encounter: Payer: Self-pay | Admitting: Family

## 2014-05-03 ENCOUNTER — Encounter: Payer: Self-pay | Admitting: General Practice

## 2014-05-06 ENCOUNTER — Encounter (HOSPITAL_COMMUNITY): Payer: Self-pay | Admitting: *Deleted

## 2014-05-06 ENCOUNTER — Inpatient Hospital Stay (HOSPITAL_COMMUNITY)
Admission: AD | Admit: 2014-05-06 | Discharge: 2014-05-06 | Disposition: A | Discharge status: Home / Self Care | Payer: Medicaid Other | Source: Ambulatory Visit | Attending: Obstetrics & Gynecology | Admitting: Obstetrics & Gynecology

## 2014-05-06 DIAGNOSIS — R109 Unspecified abdominal pain: Secondary | ICD-10-CM | POA: Insufficient documentation

## 2014-05-06 DIAGNOSIS — G44209 Tension-type headache, unspecified, not intractable: Secondary | ICD-10-CM | POA: Insufficient documentation

## 2014-05-06 DIAGNOSIS — O9989 Other specified diseases and conditions complicating pregnancy, childbirth and the puerperium: Principal | ICD-10-CM

## 2014-05-06 DIAGNOSIS — M549 Dorsalgia, unspecified: Secondary | ICD-10-CM | POA: Insufficient documentation

## 2014-05-06 DIAGNOSIS — O99330 Smoking (tobacco) complicating pregnancy, unspecified trimester: Secondary | ICD-10-CM | POA: Insufficient documentation

## 2014-05-06 DIAGNOSIS — O99891 Other specified diseases and conditions complicating pregnancy: Secondary | ICD-10-CM | POA: Insufficient documentation

## 2014-05-06 DIAGNOSIS — O99019 Anemia complicating pregnancy, unspecified trimester: Secondary | ICD-10-CM

## 2014-05-06 DIAGNOSIS — K089 Disorder of teeth and supporting structures, unspecified: Secondary | ICD-10-CM | POA: Insufficient documentation

## 2014-05-06 DIAGNOSIS — G44211 Episodic tension-type headache, intractable: Secondary | ICD-10-CM

## 2014-05-06 LAB — URINE MICROSCOPIC-ADD ON

## 2014-05-06 LAB — URINALYSIS, ROUTINE W REFLEX MICROSCOPIC
Bilirubin Urine: NEGATIVE
GLUCOSE, UA: NEGATIVE mg/dL
Hgb urine dipstick: NEGATIVE
Ketones, ur: NEGATIVE mg/dL
Nitrite: NEGATIVE
PROTEIN: NEGATIVE mg/dL
Specific Gravity, Urine: <1.005 — ABNORMAL LOW (ref 1.005–1.030)
Urobilinogen, UA: 0.2 mg/dL (ref 0.0–1.0)
pH: 5.5 (ref 5.0–8.0)

## 2014-05-06 MED ORDER — LACTATED RINGERS IV BOLUS (SEPSIS)
1000.0000 mL | Freq: Once | INTRAVENOUS | Status: AC
  Administered 2014-05-06: 1000 mL via INTRAVENOUS

## 2014-05-06 MED ORDER — DIPHENHYDRAMINE HCL 50 MG/ML IJ SOLN
25.0000 mg | Freq: Once | INTRAMUSCULAR | Status: AC
  Administered 2014-05-06: 25 mg via INTRAVENOUS
  Filled 2014-05-06: qty 1

## 2014-05-06 MED ORDER — DEXAMETHASONE SODIUM PHOSPHATE 10 MG/ML IJ SOLN
10.0000 mg | Freq: Once | INTRAMUSCULAR | Status: AC
  Administered 2014-05-06: 10 mg via INTRAVENOUS
  Filled 2014-05-06: qty 1

## 2014-05-06 MED ORDER — ACETAMINOPHEN 325 MG PO TABS
650.0000 mg | ORAL_TABLET | Freq: Once | ORAL | Status: AC
  Administered 2014-05-06: 650 mg via ORAL
  Filled 2014-05-06: qty 2

## 2014-05-06 MED ORDER — METOCLOPRAMIDE HCL 5 MG/ML IJ SOLN
10.0000 mg | Freq: Once | INTRAMUSCULAR | Status: AC
  Administered 2014-05-06: 10 mg via INTRAVENOUS
  Filled 2014-05-06: qty 2

## 2014-05-06 NOTE — MAU Provider Note (Signed)
None     Chief Complaint:  Back Pain, Dental Pain, Headache and Pelvic Pain   Ane Seaberry is  18 y.o. [id number] at 3417w3d presents complaining of Back Pain, Dental Pain, Headache and Pelvic Pain For about 3 days.  Has not taken any medication for it before because "tylenol doesn't work".  Has had off an on toothaches and thinks it where her widsom teeth are coming in.  Has lower abdominal pain bilaterally, exacerbated by movement Obstetrical/Gynecological History: OB History   Grav Para Term Preterm Abortions TAB SAB Ect Mult Living   3 2 2       2      [facility]  Past Medical History: Past Medical History  Diagnosis Date  . Asthma   . Preeclampsia in postpartum period   . Anemia   . Pregnancy induced hypertension 2011    post partum    Past Surgical History: Past Surgical History  Procedure Laterality Date  . No past surgeries      Family History: Family History  Problem Relation Age of Onset  . Anemia Mother   . Depression Mother   . Asthma Brother   . Diabetes Maternal Grandmother   . Heart disease Paternal Grandfather     Social History: History  Substance Use Topics  . Smoking status: Current Some Day Smoker -- 0.25 packs/day    Types: Cigars, Cigarettes  . Smokeless tobacco: Never Used  . Alcohol Use: No    Allergies: No Known Allergies  Meds:  Facility-administered medications prior to admission  Medication Dose Route Frequency Provider Last Rate Last Dose  . Tdap (BOOSTRIX) injection 0.5 mL  0.5 mL Intramuscular Once Walidah N Muhammad, CNM       Prescriptions prior to admission  Medication Sig Dispense Refill  . albuterol (PROVENTIL HFA;VENTOLIN HFA) 108 (90 BASE) MCG/ACT inhaler Inhale 2 puffs into the lungs every 6 (six) hours as needed for wheezing or shortness of breath.  1 Inhaler  2  . azithromycin (ZITHROMAX) 250 MG tablet Take 1 tablet by mouth daily for 4 days.  4 each  0  . Budesonide 90 MCG/ACT inhaler Inhale 1  puff into the lungs 2 (two) times daily.  1 Inhaler  2  . Fe Fum-FePoly-FA-Vit C-Vit B3 (INTEGRA F) 125-1 MG CAPS Take 1 capsule by mouth daily.  30 capsule  3  . predniSONE (DELTASONE) 20 MG tablet Take 2 tablets (40 mg total) by mouth daily with breakfast.  8 tablet  0  . Prenatal Vit-Fe Fumarate-FA (PRENATAL MULTIVITAMIN) TABS tablet Take 1 tablet by mouth daily at 12 noon.        Review of Systems   Constitutional: Negative for fever and chills Eyes: Negative for visual disturbances Respiratory: Negative for shortness of breath, dyspnea Cardiovascular: Negative for chest pain or palpitations  Gastrointestinal: Negative for vomiting, diarrhea and constipation Genitourinary: Negative for dysuria and urgency Musculoskeletal: Negative for back pain, joint pain, myalgias  Neurological: Negative for dizziness    Physical Exam  Blood pressure 125/67, pulse 97, temperature 98.4 F (36.9 C), temperature source Oral, height 5\' 3"  (1.6 m), weight 65.772 kg (145 lb), last menstrual period 08/25/2013, SpO2 98.00%. GENERAL: Well-developed, well-nourished female in no acute distress.  HEENT:  PEERLA; no abscesses or erythema in mouth/teeth.  HA is in back and side of head.  No trigger points on the neck LUNGS: Clear to auscultation bilaterally.  HEART: Regular rate and rhythm. ABDOMEN: Soft, nontender,  nondistended, gravid.  EXTREMITIES: Nontender, no edema, 2+ distal pulses. DTR's 2+ CERVICAL EXAM: Dilatation 1-2cm   Effacement 0%   Station -2   Presentation: cephalic FHT:  Baseline rate 140 bpm   Variability moderate  Accelerations present   Decelerations none Contractions: Every 0 mins   Labs: No results found for this or any previous visit (from the past 24 hour(s)). Imaging Studies:  No results found.  Assessment: Miamarie Trevor is  18 y.o. [id number] at 5265w3d presents with: Tnesion type HA Toothache 2/2 wisdom teeth coming in  Round ligament pain.  Plan: HA cocktail, tylenol (pain  relieved) Make appt with dentist after baby is born Round ligament pain relief measures discussed Keep appt 6/25 at 0745  CRESENZO-DISHMAN,FRANCES 6/19/201512:37 AM

## 2014-05-06 NOTE — MAU Note (Signed)
Pt reports a lot of pain in her lower abd/pelvic area x 2 days. Reports a "migraine" for the last 3 days, toothache and also lower back pain.

## 2014-05-06 NOTE — MAU Note (Signed)
PT  SAYS HER H/A -  -  STARTED HURTING ON Monday.    SAYS HER RIGHT TEETH STARTED HURTING  Tuesday.    SAYS LOWER ABD PAIN-  STARTED  2 WEEKS AGO-   CALLED-  BUT UNSURE- ABOUT APPOINTMENT ON 7-29.  LAST SEX-   2 WEEKS AGO. DENIES HSV AND MRSA.  NO VE IN CLINIC.

## 2014-05-11 ENCOUNTER — Inpatient Hospital Stay (HOSPITAL_COMMUNITY)
Admission: AD | Admit: 2014-05-11 | Discharge: 2014-05-14 | DRG: 775 | Disposition: A | Discharge status: Home / Self Care | Payer: Medicaid Other | Source: Ambulatory Visit | Attending: Obstetrics & Gynecology | Admitting: Obstetrics & Gynecology

## 2014-05-11 ENCOUNTER — Encounter (HOSPITAL_COMMUNITY): Payer: Self-pay | Admitting: *Deleted

## 2014-05-11 DIAGNOSIS — D649 Anemia, unspecified: Secondary | ICD-10-CM | POA: Diagnosis present

## 2014-05-11 DIAGNOSIS — Z3493 Encounter for supervision of normal pregnancy, unspecified, third trimester: Secondary | ICD-10-CM

## 2014-05-11 DIAGNOSIS — O09293 Supervision of pregnancy with other poor reproductive or obstetric history, third trimester: Secondary | ICD-10-CM

## 2014-05-11 DIAGNOSIS — N200 Calculus of kidney: Secondary | ICD-10-CM | POA: Diagnosis present

## 2014-05-11 DIAGNOSIS — O99019 Anemia complicating pregnancy, unspecified trimester: Secondary | ICD-10-CM

## 2014-05-11 DIAGNOSIS — Z833 Family history of diabetes mellitus: Secondary | ICD-10-CM

## 2014-05-11 DIAGNOSIS — J45909 Unspecified asthma, uncomplicated: Secondary | ICD-10-CM | POA: Diagnosis present

## 2014-05-11 DIAGNOSIS — IMO0001 Reserved for inherently not codable concepts without codable children: Secondary | ICD-10-CM

## 2014-05-11 DIAGNOSIS — O9902 Anemia complicating childbirth: Secondary | ICD-10-CM | POA: Diagnosis present

## 2014-05-11 DIAGNOSIS — O99334 Smoking (tobacco) complicating childbirth: Principal | ICD-10-CM | POA: Diagnosis present

## 2014-05-11 LAB — CBC
HEMATOCRIT: 27.6 % — AB (ref 36.0–46.0)
HEMOGLOBIN: 8.5 g/dL — AB (ref 12.0–15.0)
MCH: 24.4 pg — ABNORMAL LOW (ref 26.0–34.0)
MCHC: 30.8 g/dL (ref 30.0–36.0)
MCV: 79.3 fL (ref 78.0–100.0)
Platelets: 394 10*3/uL (ref 150–400)
RBC: 3.48 MIL/uL — AB (ref 3.87–5.11)
RDW: 27.5 % — ABNORMAL HIGH (ref 11.5–15.5)
WBC: 10.2 10*3/uL (ref 4.0–10.5)

## 2014-05-11 MED ORDER — OXYTOCIN BOLUS FROM INFUSION
500.0000 mL | INTRAVENOUS | Status: DC
  Administered 2014-05-12: 500 mL via INTRAVENOUS

## 2014-05-11 MED ORDER — ONDANSETRON HCL 4 MG/2ML IJ SOLN: 4.0000 mg | Freq: Four times a day (QID) | INTRAMUSCULAR | Status: DC | PRN

## 2014-05-11 MED ORDER — CITRIC ACID-SODIUM CITRATE 334-500 MG/5ML PO SOLN: 30.0000 mL | ORAL | Status: DC | PRN

## 2014-05-11 MED ORDER — OXYCODONE-ACETAMINOPHEN 5-325 MG PO TABS: 1.0000 | ORAL_TABLET | ORAL | Status: DC | PRN

## 2014-05-11 MED ORDER — FENTANYL CITRATE 0.05 MG/ML IJ SOLN
100.0000 ug | INTRAMUSCULAR | Status: DC | PRN
  Administered 2014-05-12: 100 ug via INTRAVENOUS
  Filled 2014-05-11: qty 2

## 2014-05-11 MED ORDER — ACETAMINOPHEN 325 MG PO TABS: 650.0000 mg | ORAL_TABLET | ORAL | Status: DC | PRN

## 2014-05-11 MED ORDER — LACTATED RINGERS IV SOLN: INTRAVENOUS | Status: DC

## 2014-05-11 MED ORDER — IBUPROFEN 600 MG PO TABS: 600.0000 mg | ORAL_TABLET | Freq: Four times a day (QID) | ORAL | Status: DC | PRN

## 2014-05-11 MED ORDER — LIDOCAINE HCL (PF) 1 % IJ SOLN
30.0000 mL | INTRAMUSCULAR | Status: AC | PRN
  Administered 2014-05-12 (×2): 4 mL via SUBCUTANEOUS

## 2014-05-11 MED ORDER — LACTATED RINGERS IV SOLN: 500.0000 mL | INTRAVENOUS | Status: DC | PRN

## 2014-05-11 MED ORDER — OXYTOCIN 40 UNITS IN LACTATED RINGERS INFUSION - SIMPLE MED
62.5000 mL/h | INTRAVENOUS | Status: DC
  Filled 2014-05-11: qty 1000

## 2014-05-12 ENCOUNTER — Encounter: Payer: Medicaid Other | Admitting: Obstetrics and Gynecology

## 2014-05-12 ENCOUNTER — Encounter (HOSPITAL_COMMUNITY): Payer: Medicaid Other | Admitting: Anesthesiology

## 2014-05-12 ENCOUNTER — Encounter (HOSPITAL_COMMUNITY): Payer: Self-pay | Admitting: *Deleted

## 2014-05-12 ENCOUNTER — Inpatient Hospital Stay (HOSPITAL_COMMUNITY): Payer: Medicaid Other | Admitting: Anesthesiology

## 2014-05-12 DIAGNOSIS — O99334 Smoking (tobacco) complicating childbirth: Secondary | ICD-10-CM | POA: Diagnosis not present

## 2014-05-12 DIAGNOSIS — D649 Anemia, unspecified: Secondary | ICD-10-CM

## 2014-05-12 DIAGNOSIS — J45909 Unspecified asthma, uncomplicated: Secondary | ICD-10-CM

## 2014-05-12 DIAGNOSIS — N200 Calculus of kidney: Secondary | ICD-10-CM

## 2014-05-12 LAB — GROUP B STREP BY PCR: GROUP B STREP BY PCR: POSITIVE — AB

## 2014-05-12 LAB — TYPE AND SCREEN
ABO/RH(D): O POS
Antibody Screen: NEGATIVE

## 2014-05-12 LAB — OB RESULTS CONSOLE GBS: GBS: POSITIVE

## 2014-05-12 LAB — RAPID URINE DRUG SCREEN, HOSP PERFORMED
AMPHETAMINES: NOT DETECTED
BENZODIAZEPINES: NOT DETECTED
Barbiturates: NOT DETECTED
COCAINE: NOT DETECTED
OPIATES: NOT DETECTED
TETRAHYDROCANNABINOL: NOT DETECTED

## 2014-05-12 LAB — RPR

## 2014-05-12 MED ORDER — TETANUS-DIPHTH-ACELL PERTUSSIS 5-2.5-18.5 LF-MCG/0.5 IM SUSP: 0.5000 mL | Freq: Once | INTRAMUSCULAR | Status: DC

## 2014-05-12 MED ORDER — HYDROCOD POLST-CHLORPHEN POLST 10-8 MG/5ML PO LQCR
5.0000 mL | Freq: Two times a day (BID) | ORAL | Status: DC
  Administered 2014-05-13 (×2): 5 mL via ORAL
  Filled 2014-05-12 (×5): qty 5

## 2014-05-12 MED ORDER — LANOLIN HYDROUS EX OINT: TOPICAL_OINTMENT | CUTANEOUS | Status: DC | PRN

## 2014-05-12 MED ORDER — PRENATAL MULTIVITAMIN CH
1.0000 | ORAL_TABLET | Freq: Every day | ORAL | Status: DC
  Administered 2014-05-13 – 2014-05-14 (×2): 1 via ORAL
  Filled 2014-05-12 (×2): qty 1

## 2014-05-12 MED ORDER — ZOLPIDEM TARTRATE 5 MG PO TABS
5.0000 mg | ORAL_TABLET | Freq: Every evening | ORAL | Status: DC | PRN
Start: 2014-05-12 — End: 2014-05-14

## 2014-05-12 MED ORDER — LACTATED RINGERS IV SOLN: 500.0000 mL | Freq: Once | INTRAVENOUS | Status: DC

## 2014-05-12 MED ORDER — EPHEDRINE 5 MG/ML INJ
10.0000 mg | INTRAVENOUS | Status: DC | PRN
  Filled 2014-05-12: qty 2

## 2014-05-12 MED ORDER — OXYCODONE-ACETAMINOPHEN 5-325 MG PO TABS
1.0000 | ORAL_TABLET | ORAL | Status: DC | PRN
  Administered 2014-05-13 (×2): 1 via ORAL
  Filled 2014-05-12 (×3): qty 1

## 2014-05-12 MED ORDER — EPHEDRINE 5 MG/ML INJ
10.0000 mg | INTRAVENOUS | Status: DC | PRN
  Filled 2014-05-12: qty 4
  Filled 2014-05-12: qty 2

## 2014-05-12 MED ORDER — PHENYLEPHRINE 40 MCG/ML (10ML) SYRINGE FOR IV PUSH (FOR BLOOD PRESSURE SUPPORT)
80.0000 ug | PREFILLED_SYRINGE | INTRAVENOUS | Status: DC | PRN
  Filled 2014-05-12: qty 2

## 2014-05-12 MED ORDER — DIBUCAINE 1 % RE OINT
1.0000 | TOPICAL_OINTMENT | RECTAL | Status: DC | PRN
Start: 2014-05-12 — End: 2014-05-14
  Administered 2014-05-13: 1 via RECTAL
  Filled 2014-05-12: qty 28

## 2014-05-12 MED ORDER — DIPHENHYDRAMINE HCL 25 MG PO CAPS: 25.0000 mg | ORAL_CAPSULE | Freq: Four times a day (QID) | ORAL | Status: DC | PRN

## 2014-05-12 MED ORDER — WITCH HAZEL-GLYCERIN EX PADS
1.0000 "application " | MEDICATED_PAD | CUTANEOUS | Status: DC | PRN
  Administered 2014-05-13: 1 via TOPICAL

## 2014-05-12 MED ORDER — FENTANYL 2.5 MCG/ML BUPIVACAINE 1/10 % EPIDURAL INFUSION (WH - ANES)
13.0000 mL/h | INTRAMUSCULAR | Status: DC | PRN
  Administered 2014-05-12: 13 mL/h via EPIDURAL
  Filled 2014-05-12: qty 125

## 2014-05-12 MED ORDER — SENNOSIDES-DOCUSATE SODIUM 8.6-50 MG PO TABS
2.0000 | ORAL_TABLET | ORAL | Status: DC
  Administered 2014-05-12 – 2014-05-13 (×2): 2 via ORAL
  Filled 2014-05-12 (×2): qty 2

## 2014-05-12 MED ORDER — PHENYLEPHRINE 40 MCG/ML (10ML) SYRINGE FOR IV PUSH (FOR BLOOD PRESSURE SUPPORT)
80.0000 ug | PREFILLED_SYRINGE | INTRAVENOUS | Status: DC | PRN
  Filled 2014-05-12: qty 2
  Filled 2014-05-12: qty 10

## 2014-05-12 MED ORDER — ONDANSETRON HCL 4 MG PO TABS: 4.0000 mg | ORAL_TABLET | ORAL | Status: DC | PRN

## 2014-05-12 MED ORDER — IBUPROFEN 600 MG PO TABS
600.0000 mg | ORAL_TABLET | Freq: Four times a day (QID) | ORAL | Status: DC
  Administered 2014-05-12 – 2014-05-14 (×8): 600 mg via ORAL
  Filled 2014-05-12 (×8): qty 1

## 2014-05-12 MED ORDER — SIMETHICONE 80 MG PO CHEW
80.0000 mg | CHEWABLE_TABLET | ORAL | Status: DC | PRN
  Administered 2014-05-13: 80 mg via ORAL
  Filled 2014-05-12: qty 1

## 2014-05-12 MED ORDER — BENZOCAINE-MENTHOL 20-0.5 % EX AERO
1.0000 "application " | INHALATION_SPRAY | CUTANEOUS | Status: DC | PRN
  Administered 2014-05-12: 1 via TOPICAL
  Filled 2014-05-12: qty 56

## 2014-05-12 MED ORDER — PENICILLIN G POTASSIUM 5000000 UNITS IJ SOLR
2.5000 10*6.[IU] | INTRAVENOUS | Status: DC
  Filled 2014-05-12 (×3): qty 2.5

## 2014-05-12 MED ORDER — DIPHENHYDRAMINE HCL 50 MG/ML IJ SOLN: 12.5000 mg | INTRAMUSCULAR | Status: DC | PRN

## 2014-05-12 MED ORDER — ONDANSETRON HCL 4 MG/2ML IJ SOLN: 4.0000 mg | INTRAMUSCULAR | Status: DC | PRN

## 2014-05-12 MED ORDER — PENICILLIN G POTASSIUM 5000000 UNITS IJ SOLR
5.0000 10*6.[IU] | Freq: Once | INTRAVENOUS | Status: AC
  Administered 2014-05-12: 5 10*6.[IU] via INTRAVENOUS
  Filled 2014-05-12: qty 5

## 2014-05-12 NOTE — H&P (Signed)
Yvonne Castillo is a 18 y.o. female G3P2002 @ 39.2wks by 27wk scan presenting for eval of ctx. Denies leaking or bldg. Reports +FM. [facility] and has been remarkable for 1) intermittent PNC with 3 visits total (27, 28 & 32wks) 2) asthma 3) PP preeclampsia with 1st preg 4) kidney stones with current preg 5) anemia History OB History   Grav Para Term Preterm Abortions TAB SAB Ect Mult Living   3 2 2       2      Past Medical History  Diagnosis Date  . Asthma   . Preeclampsia in postpartum period   . Anemia   . Pregnancy induced hypertension 2011    post partum   Past Surgical History  Procedure Laterality Date  . No past surgeries     Family History: family history includes Anemia in her mother; Asthma in her brother; Depression in her mother; Diabetes in her maternal grandmother; Heart disease in her paternal grandfather. Social History:  reports that she has been smoking Cigars and Cigarettes.  She has been smoking about 0.25 packs per day. She has never used smokeless tobacco. She reports that she does not drink alcohol or use illicit drugs.   Prenatal Transfer Tool  Maternal Diabetes: No Genetic Screening: Declined- too late Maternal Ultrasounds/Referrals: Normal Fetal Ultrasounds or other Referrals:  None Maternal Substance Abuse:  No UDS pending Significant Maternal Medications:  None Significant Maternal Lab Results:  None GBS pending Other Comments:  prenatal care x 3 visits (27, 28 & 32wks)  ROS  Dilation: 5.5 Effacement (%): 100 Station: -2 Exam by:: Violeta GelinasG. Whitfield, RN Blood pressure 125/67, pulse 87, temperature 98.1 F (36.7 C), temperature source Oral, resp. rate 20, height 5\' 3"  (1.6 m), weight 65.772 kg (145 lb), last menstrual period 08/25/2013. Exam Physical Exam  Constitutional: She is oriented to person, place, and time. She appears well-developed.  HENT:  Head: Normocephalic.  Neck: Normal range of motion.  Respiratory: Effort  normal.  GI:  EFM 135-145, +accels, no decels Ctx reg q 3-5 mins  Musculoskeletal: Normal range of motion.  Neurological: She is alert and oriented to person, place, and time.  Skin: Skin is warm and dry.  Psychiatric: She has a normal mood and affect. Her behavior is normal. Thought content normal.    Prenatal labs: ABO, Rh: O/POS/-- (05/07 1217) Antibody: NEG (05/07 1217) Rubella: 1.36 (05/07 1217) RPR: NON REAC (05/07 1217)  HBsAg: NEGATIVE (05/07 1217)  HIV: NONREACTIVE (05/07 1217)  GBS:     Assessment/Plan: IUP @ 39.2wks Early active labor GBS PCR pending  [facility] Anticipate SVD   Cam HaiSHAW, KIMBERLY CNM 05/12/2014, 12:43 AM

## 2014-05-12 NOTE — Progress Notes (Signed)
UR chart review completed.  

## 2014-05-12 NOTE — Progress Notes (Signed)
Catherene Chain is a 18 y.o. G3P2002 at 4558w2d   Subjective: Comfortable with epidural  Objective: BP 116/68  Pulse 78  Temp(Src) 98 F (36.7 C) (Oral)  Resp 20  Ht 5\' 3"  (1.6 m)  Wt 65.772 kg (145 lb)  BMI 25.69 kg/m2  SpO2 100%  LMP 08/25/2013 I/O last 3 completed shifts: In: 240 [P.O.:240] Out: 300 [Urine:300]    FHT:  FHR: 120s bpm, variability: moderate,  accelerations:  Present,  decelerations:  Absent UC:   regular, every 4-5 minutes SVE:   Dilation: 9 Effacement (%): 100 Station: +2 Exam by:: G. Whitfield,RN/L.Carpenter,RN  Labs: Lab Results  Component Value Date   WBC 10.2 05/11/2014   HGB 8.5* 05/11/2014   HCT 27.6* 05/11/2014   MCV 79.3 05/11/2014   PLT 394 05/11/2014    Assessment / Plan: Active labor/transition  Await urge to push, or reexamine cx in approx 1 hour  SHAW, KIMBERLY CNM 05/12/2014, 7:45 AM

## 2014-05-12 NOTE — Anesthesia Preprocedure Evaluation (Signed)
Anesthesia Evaluation  Patient identified by MRN, date of birth, ID band Patient awake    Reviewed: Allergy & Precautions, H&P , NPO status , Patient's Chart, lab work & pertinent test results  History of Anesthesia Complications Negative for: history of anesthetic complications  Airway Mallampati: II TM Distance: >3 FB Neck ROM: Full    Dental  (+) Teeth Intact   Pulmonary asthma , Current Smoker,          Cardiovascular hypertension, Rhythm:Regular     Neuro/Psych negative neurological ROS     GI/Hepatic negative GI ROS, Neg liver ROS,   Endo/Other  negative endocrine ROS  Renal/GU negative Renal ROS     Musculoskeletal   Abdominal   Peds  Hematology  (+) anemia ,   Anesthesia Other Findings   Reproductive/Obstetrics (+) Pregnancy                           Anesthesia Physical Anesthesia Plan  ASA: II  Anesthesia Plan: Epidural   Post-op Pain Management:    Induction:   Airway Management Planned:   Additional Equipment:   Intra-op Plan:   Post-operative Plan:   Informed Consent: I have reviewed the patients History and Physical, chart, labs and discussed the procedure including the risks, benefits and alternatives for the proposed anesthesia with the patient or authorized representative who has indicated his/her understanding and acceptance.   Dental advisory given  Plan Discussed with: Anesthesiologist  Anesthesia Plan Comments:         Anesthesia Quick Evaluation

## 2014-05-12 NOTE — Anesthesia Postprocedure Evaluation (Signed)
Anesthesia Post Note  Patient: Yvonne Castillo  Procedure(s) Performed: * No procedures listed *  Anesthesia type: Epidural  Patient location: Mother/Baby  Post pain: Pain level controlled  Post assessment: Post-op Vital signs reviewed  Last Vitals:  Filed Vitals:   05/12/14 1253  BP: 116/79  Pulse: 71  Temp: 36.6 C  Resp: 20    Post vital signs: Reviewed  Level of consciousness:alert  Complications: No apparent anesthesia complications

## 2014-05-12 NOTE — Lactation Note (Signed)
This note was copied from the chart of Yvonne Ansleigh Kowal. Lactation Consultation Note  P3, Mother states she wants to formula feed but her baby will not drink very much formula.  She states she thinks her baby likes to bf better. Visitor in room encouraging to mother about bf.  Reviewed benefits of bf but also stated we would support her however she chooses to feed her child. Baby STS on mother's chest and rooting.  Offered to assist with bf but mother states she will bf later. Reviewed size of baby's stomach and provided mother with volume guidelines for formula. Encouraged mother to call if she needs any assistance.     Patient Name: Yvonne Castillo Today's Date: 05/12/2014 Reason for consult: Initial assessment   Maternal Data Does the patient have breastfeeding experience prior to this delivery?: Yes  Feeding    LATCH Score/Interventions                      Lactation Tools Discussed/Used     Consult Status Consult Status: PRN    Hardie PulleyBerkelhammer, Ruth Boschen 05/12/2014, 7:58 PM

## 2014-05-12 NOTE — Anesthesia Procedure Notes (Signed)
Epidural Patient location during procedure: OB Start time: 05/12/2014 1:26 AM End time: 05/12/2014 1:41 AM  Staffing Anesthesiologist: Ermal Brzozowski, CHRIS Performed by: anesthesiologist   Preanesthetic Checklist Completed: patient identified, surgical consent, pre-op evaluation, timeout performed, IV checked, risks and benefits discussed and monitors and equipment checked  Epidural Patient position: sitting Prep: site prepped and draped and DuraPrep Patient monitoring: heart rate, cardiac monitor, continuous pulse ox and blood pressure Approach: midline Location: L3-L4 Injection technique: LOR saline  Needle:  Needle type: Tuohy  Needle gauge: 17 G Needle length: 9 cm Needle insertion depth: 4 cm Catheter type: closed end flexible Catheter size: 19 Gauge Catheter at skin depth: 10 cm Test dose: Other  Assessment Events: blood not aspirated, injection not painful, no injection resistance, negative IV test and no paresthesia  Additional Notes H+P and labs checked, risks and benefits discussed with the patient, consent obtained, procedure tolerated well and without complications.  Reason for block:procedure for pain

## 2014-05-13 MED ORDER — MEDROXYPROGESTERONE ACETATE 150 MG/ML IM SUSP
150.0000 mg | Freq: Once | INTRAMUSCULAR | Status: AC
  Administered 2014-05-14: 150 mg via INTRAMUSCULAR
  Filled 2014-05-13: qty 1

## 2014-05-13 MED ORDER — PNEUMOCOCCAL VAC POLYVALENT 25 MCG/0.5ML IJ INJ
0.5000 mL | INJECTION | INTRAMUSCULAR | Status: DC
  Filled 2014-05-13: qty 0.5

## 2014-05-13 NOTE — Progress Notes (Signed)
Post Partum Day 1 Subjective: no complaints, up ad lib, voiding, tolerating PO and + flatus  Objective: Blood pressure 116/58, pulse 57, temperature 98 F (36.7 C), temperature source Oral, resp. rate 18, height 5\' 3"  (1.6 m), weight 65.772 kg (145 lb), last menstrual period 08/25/2013, SpO2 100.00%, unknown if currently breastfeeding.  Physical Exam:  General: alert, cooperative and no distress Lochia: appropriate Uterine Fundus: firm Incision: n/a DVT Evaluation: No evidence of DVT seen on physical exam. No cords or calf tenderness. No significant calf/ankle edema.   Recent Labs  05/11/14 2340  HGB 8.5*  HCT 27.6*    Assessment/Plan: Pt doing well this morning SW consult - intermittent PNCare at only 27, 28 & 32 wks Breastfeeding currently but would like to bottle feed Discussed MOC: Depo Provera   LOS: 2 days   Bing PlumeGervasi, Kristin E 05/13/2014, 7:29 AM   I have seen and examined this patient and agree with above documentation in the PA student's note.   Rulon AbideKeli Beck, M.D. [facility] 05/13/2014 10:12 AM

## 2014-05-13 NOTE — Lactation Note (Signed)
This note was copied from the chart of Yvonne Royalti Perfecto. Lactation Consultation Note     Follow up consult with this mom of a term baby, who's choice on admission was formula, but has breast fed until now.  When I asked if she was still breast feeding, she said not anymore. I asked her why she breast fed for 1 day, and she said"I heard it makes your stomach flat"  I reviewed with mom briefly the benefit of breast milk as compared to , asked that mom think seriously about it, and call if she had questions/concerns.  Patient Name: Yvonne Castillo Today's Date: 05/13/2014 Reason for consult: Follow-up assessment   Maternal Data Formula Feeding for Exclusion: Yes Reason for exclusion: Mother's choice to formula feed on admision  Feeding Feeding Type: Breast Fed Length of feed: 20 min  LATCH Score/Interventions                      Lactation Tools Discussed/Used     Consult Status Consult Status: Complete Follow-up type: Call as needed    Yvonne Castillo, Yvonne Castillo 05/13/2014, 2:53 PM

## 2014-05-14 MED ORDER — MEDROXYPROGESTERONE ACETATE 150 MG/ML IM SUSP: 150.0000 mg | Freq: Once | INTRAMUSCULAR | Status: DC

## 2014-05-14 MED ORDER — IBUPROFEN 600 MG PO TABS: 600.0000 mg | ORAL_TABLET | Freq: Four times a day (QID) | ORAL | Status: DC

## 2014-05-14 NOTE — Discharge Instructions (Signed)
Vaginal Delivery °Care After °Refer to this sheet in the next few weeks. These discharge instructions provide you with information on caring for yourself after delivery. Your caregiver may also give you specific instructions. Your treatment has been planned according to the most current medical practices available, but problems sometimes occur. Call your caregiver if you have any problems or questions after you go home. °HOME CARE INSTRUCTIONS °· Take over-the-counter or prescription medicines only as directed by your caregiver or pharmacist. °· Do not drink alcohol, especially if you are breastfeeding or taking medicine to relieve pain. °· Do not chew or smoke tobacco. °· Do not use illegal drugs. °· Continue to use good perineal care. Good perineal care includes: °¨ Wiping your perineum from front to back. °¨ Keeping your perineum clean. °· Do not use tampons or douche until your caregiver says it is okay. °· Shower, wash your hair, and take tub baths as directed by your caregiver. °· Wear a well-fitting bra that provides breast support. °· Eat healthy foods. °· Drink enough fluids to keep your urine clear or pale yellow. °· Eat high-fiber foods such as whole grain cereals and breads, brown rice, beans, and fresh fruits and vegetables every day. These foods may help prevent or relieve constipation. °· Follow your cargiver's recommendations regarding resumption of activities such as climbing stairs, driving, lifting, exercising, or traveling. °· Talk to your caregiver about resuming sexual activities. Resumption of sexual activities is dependent upon your risk of infection, your rate of healing, and your comfort and desire to resume sexual activity. °· Try to have someone help you with your household activities and your newborn for at least a few days after you leave the hospital. °· Rest as much as possible. Try to rest or take a nap when your newborn is sleeping. °· Increase your activities gradually. °· Keep all  of your scheduled postpartum appointments. It is very important to keep your scheduled follow-up appointments. At these appointments, your caregiver will be checking to make sure that you are healing physically and emotionally. °SEEK MEDICAL CARE IF:  °· You are passing large clots from your vagina. Save any clots to show your caregiver. °· You have a foul smelling discharge from your vagina. °· You have trouble urinating. °· You are urinating frequently. °· You have pain when you urinate. °· You have a change in your bowel movements. °· You have increasing redness, pain, or swelling near your vaginal incision (episiotomy) or vaginal tear. °· You have pus draining from your episiotomy or vaginal tear. °· Your episiotomy or vaginal tear is separating. °· You have painful, hard, or reddened breasts. °· You have a severe headache. °· You have blurred vision or see spots. °· You feel sad or depressed. °· You have thoughts of hurting yourself or your newborn. °· You have questions about your care, the care of your newborn, or medicines. °· You are dizzy or lightheaded. °· You have a rash. °· You have nausea or vomiting. °· You were breastfeeding and have not had a menstrual period within 12 weeks after you stopped breastfeeding. °· You are not breastfeeding and have not had a menstrual period by the 12th week after delivery. °· You have a fever. °SEEK IMMEDIATE MEDICAL CARE IF:  °· You have persistent pain. °· You have chest pain. °· You have shortness of breath. °· You faint. °· You have leg pain. °· You have stomach pain. °· Your vaginal bleeding saturates two or more sanitary pads   in 1 hour. °MAKE SURE YOU:  °· Understand these instructions. °· Will watch your condition. °· Will get help right away if you are not doing well or get worse. ° ° °Document Released: 11/01/2000 Document Revised: 07/29/2012 Document Reviewed: 07/01/2012 °ExitCare® Patient Information ©2015 [employer]. This information is not intended to  replace advice given to you by your health care provider. Make sure you discuss any questions you have with your health care provider. ° °

## 2014-05-14 NOTE — Progress Notes (Signed)
Clinical Social Work Department PSYCHOSOCIAL ASSESSMENT - MATERNAL/CHILD 05/14/2014  Patient:  Yvonne Castillo, Yvonne Castillo  Account Number:  [id number]  Admit Date:  05/11/2014  Ardine Eng Name:   Ian Malkin Uphoff    Clinical Social Worker:  CUMI BEVEL, LCSW   Date/Time:  05/14/2014 12:00 N  Date Referred:  05/13/2014      Referred reason  [facility]   Other referral source:    I:  FAMILY / HOME ENVIRONMENT Child's legal guardian:  PARENT  Guardian - Name Guardian - Age Guardian - Address  [place] [address]   Helyn Numbers     Other household support members/support persons Other support:    II  PSYCHOSOCIAL DATA Information Source:    Occupational hygienist Employment:   Supported by relatives and Automotive engineer resources:  [employer] / Grade:   Maternity Care Coordinator / Child Services Coordination / Early Interventions:  Cultural issues impacting care:    III  STRENGTHS Strengths  Supportive family/friends  Home prepared for Child (including basic supplies)  Adequate Resources   Strength comment:    IV  RISK FACTORS AND CURRENT PROBLEMS Current Problem:       V  SOCIAL WORK ASSESSMENT Met with mother who was pleasant and receptive to social work intervention.  She is a single parent with two other dependents ages 76 and 40.  She and newborn will be residing with her parents along with her other children.    Father of baby is also reportedly not working.  Mother states that she had limited [facility] because she started late and it was difficult for her to get early appointments.  She denies hx of mental illness or substance abuse.  UDS on newborn is negative.  Mother informed of the hospital's drug screen policy.   She reports having an adequate support system. No acute social concerns noted or reported at this time. 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      VI SOCIAL WORK PLAN Social Work Plan  No  Barriers to Discharge   Type of pt/family education:   If child protective services report - county:   If child protective services report - date:   Information/referral to community resources comment:   Mother selected [place] [facility] for well baby care   Other social work plan:   Will follow up with drug screen

## 2014-05-14 NOTE — Discharge Summary (Signed)
Obstetric Discharge Summary Reason for Admission: onset of labor Prenatal Procedures: none Intrapartum Procedures: spontaneous vaginal delivery and GBS prophylaxis Postpartum Procedures: none Complications-Operative and Postpartum: none Hemoglobin  Date Value Ref Range Status  05/11/2014 8.5* 12.0 - 15.0 g/dL Final     HCT  Date Value Ref Range Status  05/11/2014 27.6* 36.0 - 46.0 % Final   Yvonne Castillo is a 18yo G3P2002 admitted at 39.2wks in early active labor shortly after midnight. She received three prenatal visits at 7027, 28 and 32wks [facility]. UDS neg. She progressed to SVD later than morning. By PPD#2 she is doing well and is deemed to have received the full benefit of her hospital stay. She is breastfeeding and desires Depo. Will administer prior to d/c, although I strongly encouraged a LARC due to pt age and parity- pt noncommital. Will have SW see prior to d/c if possible due to very limited PNC.  Physical Exam:  General: alert, cooperative and no distress Heart: RRR Lungs: nl effort Lochia: appropriate Uterine Fundus: firm DVT Evaluation: No evidence of DVT seen on physical exam.  Discharge Diagnoses: Term Pregnancy-delivered  Discharge Information: Date: 05/14/2014 Activity: pelvic rest Diet: routine Medications: PNV, Ibuprofen and asthma home meds Condition: stable Instructions: refer to practice specific booklet Discharge to: home Follow-up Information   [facility] Schedule an appointment as soon as possible for a visit in 4 weeks. (For your postpartum appointment.)    Contact information:   [address] [place] [address] [phone]      Newborn Data: Live born female  Birth Weight: 7 lb 4.4 oz (3300 g) APGAR: 8, 8  Home with mother.  Cam HaiSHAW, KIMBERLY CNM 05/14/2014, 7:39 AM

## 2014-06-23 ENCOUNTER — Ambulatory Visit (INDEPENDENT_AMBULATORY_CARE_PROVIDER_SITE_OTHER): Payer: Medicaid Other | Admitting: Obstetrics and Gynecology

## 2014-06-23 NOTE — Progress Notes (Signed)
Delivered June 25. Had depo in hospital. Still bleeding.

## 2014-06-23 NOTE — Progress Notes (Signed)
  Subjective:     Yvonne Castillo is a 18 y.o. female who presents for a postpartum visit. She is 6 weeks postpartum following a spontaneous vaginal delivery. I have fully reviewed the prenatal and intrapartum course. The delivery was at 39 gestational weeks. Outcome: spontaneous vaginal delivery. Anesthesia: epidural. Postpartum course has been uncomplicated. Baby's course has been uncomplicated. Baby is feeding by bottle Rush Barer- Gerber. Bleeding is heavy. Patient reports changing 7 pads/day since discharge from the hospital. She denies chest pain, SOB, lightheadedness/dizziness. Bowel function is normal. Bladder function is normal. Patient is not sexually active. Contraception method is none. Postpartum depression screening: negative.     Review of Systems A comprehensive review of systems was negative.   Objective:    BP 100/65  Pulse 88  Ht 5\' 3"  (1.6 m)  Wt 123 lb 8 oz (56.019 kg)  BMI 21.88 kg/m2  Breastfeeding? No  General:  alert, cooperative and no distress   Breasts:  inspection negative, no nipple discharge or bleeding, no masses or nodularity palpable  Lungs: clear to auscultation bilaterally  Heart:  regular rate and rhythm  Abdomen: soft, non-tender; bowel sounds normal; no masses,  no organomegaly   Vulva:  normal  Vagina: normal vagina, no discharge, exudate, lesion, or erythema  Cervix:  multiparous appearance  Corpus: normal size, contour, position, consistency, mobility, non-tender  Adnexa:  no mass, fullness, tenderness  Rectal Exam:         Assessment:     Normal postpartum exam. Pap smear not done at today's visit.   Plan:    1. Contraception: Depo-Provera injections 2. US ordered to rule out retained POC 3. Follow up in: for next depo-provera injections  or as needed.

## 2014-06-25 ENCOUNTER — Other Ambulatory Visit: Payer: Self-pay | Admitting: Obstetrics and Gynecology

## 2014-06-27 ENCOUNTER — Ambulatory Visit (HOSPITAL_COMMUNITY): Admission: RE | Admit: 2014-06-27 | Payer: Medicaid Other | Source: Ambulatory Visit

## 2014-06-27 ENCOUNTER — Inpatient Hospital Stay (HOSPITAL_COMMUNITY): Admission: RE | Admit: 2014-06-27 | Payer: Medicaid Other | Source: Ambulatory Visit

## 2014-07-08 ENCOUNTER — Encounter: Payer: Self-pay | Admitting: General Practice

## 2014-09-19 ENCOUNTER — Encounter (HOSPITAL_COMMUNITY): Payer: Self-pay | Admitting: *Deleted

## 2014-12-29 ENCOUNTER — Emergency Department (HOSPITAL_COMMUNITY)
Admission: EM | Admit: 2014-12-29 | Discharge: 2014-12-29 | Disposition: A | Discharge status: Home / Self Care | Payer: Medicaid Other | Attending: Emergency Medicine | Admitting: Emergency Medicine

## 2014-12-29 ENCOUNTER — Emergency Department (HOSPITAL_COMMUNITY): Payer: Medicaid Other

## 2014-12-29 DIAGNOSIS — S20319A Abrasion of unspecified front wall of thorax, initial encounter: Secondary | ICD-10-CM | POA: Diagnosis not present

## 2014-12-29 DIAGNOSIS — Z7951 Long term (current) use of inhaled steroids: Secondary | ICD-10-CM | POA: Diagnosis not present

## 2014-12-29 DIAGNOSIS — Y998 Other external cause status: Secondary | ICD-10-CM | POA: Diagnosis not present

## 2014-12-29 DIAGNOSIS — S199XXA Unspecified injury of neck, initial encounter: Secondary | ICD-10-CM | POA: Diagnosis not present

## 2014-12-29 DIAGNOSIS — S29001A Unspecified injury of muscle and tendon of front wall of thorax, initial encounter: Secondary | ICD-10-CM | POA: Insufficient documentation

## 2014-12-29 DIAGNOSIS — R0789 Other chest pain: Secondary | ICD-10-CM

## 2014-12-29 DIAGNOSIS — J45909 Unspecified asthma, uncomplicated: Secondary | ICD-10-CM | POA: Insufficient documentation

## 2014-12-29 DIAGNOSIS — Z720 Tobacco use: Secondary | ICD-10-CM | POA: Insufficient documentation

## 2014-12-29 DIAGNOSIS — D649 Anemia, unspecified: Secondary | ICD-10-CM | POA: Insufficient documentation

## 2014-12-29 DIAGNOSIS — M542 Cervicalgia: Secondary | ICD-10-CM

## 2014-12-29 DIAGNOSIS — Z791 Long term (current) use of non-steroidal anti-inflammatories (NSAID): Secondary | ICD-10-CM | POA: Insufficient documentation

## 2014-12-29 DIAGNOSIS — Z79899 Other long term (current) drug therapy: Secondary | ICD-10-CM | POA: Diagnosis not present

## 2014-12-29 DIAGNOSIS — S4990XA Unspecified injury of shoulder and upper arm, unspecified arm, initial encounter: Secondary | ICD-10-CM | POA: Insufficient documentation

## 2014-12-29 DIAGNOSIS — Y9389 Activity, other specified: Secondary | ICD-10-CM | POA: Insufficient documentation

## 2014-12-29 DIAGNOSIS — Y92410 Unspecified street and highway as the place of occurrence of the external cause: Secondary | ICD-10-CM | POA: Diagnosis not present

## 2014-12-29 MED ORDER — METHOCARBAMOL 500 MG PO TABS
500.0000 mg | ORAL_TABLET | Freq: Once | ORAL | Status: AC
  Administered 2014-12-29: 500 mg via ORAL
  Filled 2014-12-29: qty 1

## 2014-12-29 MED ORDER — OXYCODONE-ACETAMINOPHEN 5-325 MG PO TABS: 1.0000 | ORAL_TABLET | ORAL | Status: DC | PRN

## 2014-12-29 MED ORDER — OXYCODONE-ACETAMINOPHEN 5-325 MG PO TABS
1.0000 | ORAL_TABLET | Freq: Once | ORAL | Status: AC
  Administered 2014-12-29: 1 via ORAL
  Filled 2014-12-29: qty 1

## 2014-12-29 MED ORDER — METHOCARBAMOL 500 MG PO TABS: 500.0000 mg | ORAL_TABLET | Freq: Two times a day (BID) | ORAL | Status: DC
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
## 2014-12-29 NOTE — Discharge Instructions (Signed)
Take the prescribed medication as directed.  Use caution when taking percocet, it can make you drowsy. Follow-up with your primary care physician. Return to the ED for new or worsening symptoms.

## 2014-12-29 NOTE — ED Notes (Signed)
Patient placed back on B/P and pulse oz after returning from xray.

## 2015-05-31 ENCOUNTER — Inpatient Hospital Stay (HOSPITAL_COMMUNITY)
Admission: AD | Admit: 2015-05-31 | Discharge: 2015-05-31 | Disposition: A | Discharge status: Home / Self Care | Payer: Medicaid Other | Source: Ambulatory Visit | Attending: Obstetrics & Gynecology | Admitting: Obstetrics & Gynecology

## 2015-05-31 ENCOUNTER — Encounter (HOSPITAL_COMMUNITY): Payer: Self-pay | Admitting: *Deleted

## 2015-05-31 DIAGNOSIS — F17210 Nicotine dependence, cigarettes, uncomplicated: Secondary | ICD-10-CM | POA: Insufficient documentation

## 2015-05-31 DIAGNOSIS — Z3201 Encounter for pregnancy test, result positive: Secondary | ICD-10-CM | POA: Diagnosis not present

## 2015-05-31 DIAGNOSIS — Z3200 Encounter for pregnancy test, result unknown: Secondary | ICD-10-CM | POA: Diagnosis present

## 2015-05-31 LAB — URINALYSIS, ROUTINE W REFLEX MICROSCOPIC
Bilirubin Urine: NEGATIVE
GLUCOSE, UA: NEGATIVE mg/dL
KETONES UR: NEGATIVE mg/dL
Leukocytes, UA: NEGATIVE
Nitrite: NEGATIVE
Protein, ur: NEGATIVE mg/dL
Specific Gravity, Urine: 1.02 (ref 1.005–1.030)
Urobilinogen, UA: 0.2 mg/dL (ref 0.0–1.0)
pH: 6.5 (ref 5.0–8.0)

## 2015-05-31 LAB — URINE MICROSCOPIC-ADD ON

## 2015-05-31 NOTE — Discharge Instructions (Signed)
Safe Medications in Pregnancy  ° °Acne: °Benzoyl Peroxide °Salicylic Acid ° °Backache/Headache: °Tylenol: 2 regular strength every 4 hours OR °             2 Extra strength every 6 hours ° °Colds/Coughs/Allergies: °Benadryl (alcohol free) 25 mg every 6 hours as needed °Breath right strips °Claritin °Cepacol throat lozenges °Chloraseptic throat spray °Cold-Eeze- up to three times per day °Cough drops, alcohol free °Flonase (by prescription only) °Guaifenesin °Mucinex °Robitussin DM (plain only, alcohol free) °Saline nasal spray/drops °Sudafed (pseudoephedrine) & Actifed ** use only after [redacted] weeks gestation and if you do not have high blood pressure °Tylenol °Vicks Vaporub °Zinc lozenges °Zyrtec  ° °Constipation: °Colace °Ducolax suppositories °Fleet enema °Glycerin suppositories °Metamucil °Milk of magnesia °Miralax °Senokot °Smooth move tea ° °Diarrhea: °Kaopectate °Imodium A-D ° °*NO pepto Bismol ° °Hemorrhoids: °Anusol °Anusol HC °Preparation H °Tucks ° °Indigestion: °Tums °Maalox °Mylanta °Zantac  °Pepcid ° °Insomnia: °Benadryl (alcohol free) 25mg every 6 hours as needed °Tylenol PM °Unisom, no Gelcaps ° °Leg Cramps: °Tums °MagGel ° °Nausea/Vomiting:  °Bonine °Dramamine °Emetrol °Ginger extract °Sea bands °Meclizine  °Nausea medication to take during pregnancy:  °Unisom (doxylamine succinate 25 mg tablets) Take one tablet daily at bedtime. If symptoms are not adequately controlled, the dose can be increased to a maximum recommended dose of two tablets daily (1/2 tablet in the morning, 1/2 tablet mid-afternoon and one at bedtime). °Vitamin B6 100mg tablets. Take one tablet twice a day (up to 200 mg per day). ° °Skin Rashes: °Aveeno products °Benadryl cream or 25mg every 6 hours as needed °Calamine Lotion °1% cortisone cream ° °Yeast infection: °Gyne-lotrimin 7 °Monistat 7 ° ° °**If taking multiple medications, please check labels to avoid duplicating the same active ingredients °**take medication as directed on  the label °** Do not exceed 4000 mg of tylenol in 24 hours °**Do not take medications that contain aspirin or ibuprofen ° ° ° °First Trimester of Pregnancy °The first trimester of pregnancy is from week 1 until the end of week 12 (months 1 through 3). A week after a sperm fertilizes an egg, the egg will implant on the wall of the uterus. This embryo will begin to develop into a baby. Genes from you and your partner are forming the baby. The female genes determine whether the baby is a boy or a girl. At 6-8 weeks, the eyes and face are formed, and the heartbeat can be seen on ultrasound. At the end of 12 weeks, all the baby's organs are formed.  °Now that you are pregnant, you will want to do everything you can to have a healthy baby. Two of the most important things are to get good prenatal care and to follow your health care provider's instructions. Prenatal care is all the medical care you receive before the baby's birth. This care will help prevent, find, and treat any problems during the pregnancy and childbirth. °BODY CHANGES °Your body goes through many changes during pregnancy. The changes vary from woman to woman.  °· You may gain or lose a couple of pounds at first. °· You may feel sick to your stomach (nauseous) and throw up (vomit). If the vomiting is uncontrollable, call your health care provider. °· You may tire easily. °· You may develop headaches that can be relieved by medicines approved by your health care provider. °· You may urinate more often. Painful urination may mean you have a bladder infection. °· You may develop heartburn as a result of your   pregnancy. °· You may develop constipation because certain hormones are causing the muscles that push waste through your intestines to slow down. °· You may develop hemorrhoids or swollen, bulging veins (varicose veins). °· Your breasts may begin to grow larger and become tender. Your nipples may stick out more, and the tissue that surrounds them (areola)  may become darker. °· Your gums may bleed and may be sensitive to brushing and flossing. °· Dark spots or blotches (chloasma, mask of pregnancy) may develop on your face. This will likely fade after the baby is born. °· Your menstrual periods will stop. °· You may have a loss of appetite. °· You may develop cravings for certain kinds of food. °· You may have changes in your emotions from day to day, such as being excited to be pregnant or being concerned that something may go wrong with the pregnancy and baby. °· You may have more vivid and strange dreams. °· You may have changes in your hair. These can include thickening of your hair, rapid growth, and changes in texture. Some women also have hair loss during or after pregnancy, or hair that feels dry or thin. Your hair will most likely return to normal after your baby is born. °WHAT TO EXPECT AT YOUR PRENATAL VISITS °During a routine prenatal visit: °· You will be weighed to make sure you and the baby are growing normally. °· Your blood pressure will be taken. °· Your abdomen will be measured to track your baby's growth. °· The fetal heartbeat will be listened to starting around week 10 or 12 of your pregnancy. °· Test results from any previous visits will be discussed. °Your health care provider may ask you: °· How you are feeling. °· If you are feeling the baby move. °· If you have had any abnormal symptoms, such as leaking fluid, bleeding, severe headaches, or abdominal cramping. °· If you have any questions. °Other tests that may be performed during your first trimester include: °· Blood tests to find your blood type and to check for the presence of any previous infections. They will also be used to check for low iron levels (anemia) and Rh antibodies. Later in the pregnancy, blood tests for diabetes will be done along with other tests if problems develop. °· Urine tests to check for infections, diabetes, or protein in the urine. °· An ultrasound to confirm  the proper growth and development of the baby. °· An amniocentesis to check for possible genetic problems. °· Fetal screens for spina bifida and Down syndrome. °· You may need other tests to make sure you and the baby are doing well. °HOME CARE INSTRUCTIONS  °Medicines °· Follow your health care provider's instructions regarding medicine use. Specific medicines may be either safe or unsafe to take during pregnancy. °· Take your prenatal vitamins as directed. °· If you develop constipation, try taking a stool softener if your health care provider approves. °Diet °· Eat regular, well-balanced meals. Choose a variety of foods, such as meat or vegetable-based protein, fish, milk and low-fat dairy products, vegetables, fruits, and whole grain breads and cereals. Your health care provider will help you determine the amount of weight gain that is right for you. °· Avoid raw meat and uncooked cheese. These carry germs that can cause birth defects in the baby. °· Eating four or five small meals rather than three large meals a day may help relieve nausea and vomiting. If you start to feel nauseous, eating a few soda crackers   can be helpful. Drinking liquids between meals instead of during meals also seems to help nausea and vomiting. °· If you develop constipation, eat more high-fiber foods, such as fresh vegetables or fruit and whole grains. Drink enough fluids to keep your urine clear or pale yellow. °Activity and Exercise °· Exercise only as directed by your health care provider. Exercising will help you: °¨ Control your weight. °¨ Stay in shape. °¨ Be prepared for labor and delivery. °· Experiencing pain or cramping in the lower abdomen or low back is a good sign that you should stop exercising. Check with your health care provider before continuing normal exercises. °· Try to avoid standing for long periods of time. Move your legs often if you must stand in one place for a long time. °· Avoid heavy lifting. °· Wear  low-heeled shoes, and practice good posture. °· You may continue to have sex unless your health care provider directs you otherwise. °Relief of Pain or Discomfort °· Wear a good support bra for breast tenderness.   °· Take warm sitz baths to soothe any pain or discomfort caused by hemorrhoids. Use hemorrhoid cream if your health care provider approves.   °· Rest with your legs elevated if you have leg cramps or low back pain. °· If you develop varicose veins in your legs, wear support hose. Elevate your feet for 15 minutes, 3-4 times a day. Limit salt in your diet. °Prenatal Care °· Schedule your prenatal visits by the twelfth week of pregnancy. They are usually scheduled monthly at first, then more often in the last 2 months before delivery. °· Write down your questions. Take them to your prenatal visits. °· Keep all your prenatal visits as directed by your health care provider. °Safety °· Wear your seat belt at all times when driving. °· Make a list of emergency phone numbers, including numbers for family, friends, the hospital, and police and fire departments. °General Tips °· Ask your health care provider for a referral to a local prenatal education class. Begin classes no later than at the beginning of month 6 of your pregnancy. °· Ask for help if you have counseling or nutritional needs during pregnancy. Your health care provider can offer advice or refer you to specialists for help with various needs. °· Do not use hot tubs, steam rooms, or saunas. °· Do not douche or use tampons or scented sanitary pads. °· Do not cross your legs for long periods of time. °· Avoid cat litter boxes and soil used by cats. These carry germs that can cause birth defects in the baby and possibly loss of the fetus by miscarriage or stillbirth. °· Avoid all smoking, herbs, alcohol, and medicines not prescribed by your health care provider. Chemicals in these affect the formation and growth of the baby. °· Schedule a dentist  appointment. At home, brush your teeth with a soft toothbrush and be gentle when you floss. °SEEK MEDICAL CARE IF:  °· You have dizziness. °· You have mild pelvic cramps, pelvic pressure, or nagging pain in the abdominal area. °· You have persistent nausea, vomiting, or diarrhea. °· You have a bad smelling vaginal discharge. °· You have pain with urination. °· You notice increased swelling in your face, hands, legs, or ankles. °SEEK IMMEDIATE MEDICAL CARE IF:  °· You have a fever. °· You are leaking fluid from your vagina. °· You have spotting or bleeding from your vagina. °· You have severe abdominal cramping or pain. °· You have rapid weight gain   or loss. °· You vomit blood or material that looks like coffee grounds. °· You are exposed to German measles and have never had them. °· You are exposed to fifth disease or chickenpox. °· You develop a severe headache. °· You have shortness of breath. °· You have any kind of trauma, such as from a fall or a car accident. °Document Released: 10/29/2001 Document Revised: 03/21/2014 Document Reviewed: 09/14/2013 °ExitCare® Patient Information ©2015 [employer]. This information is not intended to replace advice given to you by your health care provider. Make sure you discuss any questions you have with your health care provider. ° °

## 2015-05-31 NOTE — MAU Provider Note (Signed)
History     CSN: [id number]  Arrival date and time: 05/31/15 1826   First Provider Initiated Contact with Patient 05/31/15 2044      Chief Complaint  Patient presents with  . Possible Pregnancy   HPI Comments: Yvonne Castillo is a 19 y.o. [id number] at [redacted]w[redacted]d who presents today for pregnancy verification. She states that her LMP was at the end of March, around 3/28, and that she has irregular periods. She has not had any bleeding since that day. She is planning on going to [facility], but needs a pregnancy verification letter.   Possible Pregnancy This is a new problem. The current episode started 1 to 4 weeks ago (+UPT about 2 weeks ago ). Associated symptoms include vomiting (occasional ). Pertinent negatives include no abdominal pain or nausea. Nothing aggravates the symptoms. She has tried nothing for the symptoms.     Past Medical History  Diagnosis Date  . Asthma   . Preeclampsia in postpartum period   . Anemia   . Pregnancy induced hypertension 2011    post partum    Past Surgical History  Procedure Laterality Date  . No past surgeries      Family History  Problem Relation Age of Onset  . Anemia Mother   . Depression Mother   . Asthma Brother   . Diabetes Maternal Grandmother   . Heart disease Paternal Grandfather     History  Substance Use Topics  . Smoking status: Current Some Day Smoker -- 0.25 packs/day    Types: Cigars, Cigarettes  . Smokeless tobacco: Never Used  . Alcohol Use: No    Allergies: No Known Allergies  Prescriptions prior to admission  Medication Sig Dispense Refill Last Dose  . albuterol (PROVENTIL HFA;VENTOLIN HFA) 108 (90 BASE) MCG/ACT inhaler Inhale 2 puffs into the lungs every 6 (six) hours as needed for wheezing or shortness of breath. 1 Inhaler 2 Not Taking  . Budesonide 90 MCG/ACT inhaler Inhale 1 puff into the lungs 2 (two) times daily. 1 Inhaler 2 Not Taking  . Fe Fum-FePoly-FA-Vit C-Vit B3 (INTEGRA F) 125-1 MG CAPS Take 1  capsule by mouth daily. 30 capsule 3 Not Taking  . ibuprofen (ADVIL,MOTRIN) 600 MG tablet Take 1 tablet (600 mg total) by mouth every 6 (six) hours. 30 tablet 0 Not Taking  . methocarbamol (ROBAXIN) 500 MG tablet Take 1 tablet (500 mg total) by mouth 2 (two) times daily. 20 tablet 0   . oxyCODONE-acetaminophen (PERCOCET/ROXICET) 5-325 MG per tablet Take 1 tablet by mouth every 4 (four) hours as needed. 15 tablet 0   . Prenatal Vit-Fe Fumarate-FA (PRENATAL MULTIVITAMIN) TABS tablet Take 1 tablet by mouth daily at 12 noon.   Not Taking    Review of Systems  Gastrointestinal: Positive for vomiting (occasional ). Negative for nausea, abdominal pain, diarrhea and constipation.  Genitourinary: Negative for dysuria, urgency and frequency.   Physical Exam   Blood pressure 114/66, pulse 87, temperature 99 F (37.2 C), temperature source Oral, resp. rate 16, height  (1.575 m), weight 55.339 kg (122 lb), last menstrual period 02/13/2015, not currently breastfeeding.  Physical Exam  Nursing note and vitals reviewed. Constitutional: She is oriented to person, place, and time. She appears well-developed and well-nourished. No distress.  HENT:  Head: Normocephalic.  Cardiovascular: Normal rate.   GI: Soft. There is no tenderness. There is no rebound.  Genitourinary:   Uterus: 15 weeks size, +FHT 164 with doppler   Neurological: She is alert  and oriented to person, place, and time.  Skin: Skin is warm and dry.  Psychiatric: She has a normal mood and affect.    MAU Course  Procedures  MDM   Assessment and Plan   1. Encounter for pregnancy test, result positive    DC home Comfort measures reviewed  2nd Trimester precautions  RX: none  Return to MAU as needed FU with OB as planned  Follow-up Information    Schedule an appointment as soon as possible for a visit with Almon HerculesOSS,KENDRA H., MD.   Specialty:  Obstetrics and Gynecology   Contact information:   [address] [place] [id number] [phone]         Tawnya CrookHogan, Aolanis Crispen Donovan 05/31/2015, 8:48 PM

## 2015-05-31 NOTE — MAU Note (Signed)
Patient presents for possible pregnancy. Denies bleeding or discharge.

## 2015-06-03 ENCOUNTER — Encounter (HOSPITAL_COMMUNITY): Payer: Self-pay

## 2015-06-03 ENCOUNTER — Inpatient Hospital Stay (HOSPITAL_COMMUNITY)
Admission: AD | Admit: 2015-06-03 | Discharge: 2015-06-03 | Disposition: A | Discharge status: Home / Self Care | Payer: Medicaid Other | Source: Ambulatory Visit | Attending: Obstetrics & Gynecology | Admitting: Obstetrics & Gynecology

## 2015-06-03 DIAGNOSIS — R109 Unspecified abdominal pain: Secondary | ICD-10-CM | POA: Diagnosis present

## 2015-06-03 DIAGNOSIS — F17210 Nicotine dependence, cigarettes, uncomplicated: Secondary | ICD-10-CM | POA: Insufficient documentation

## 2015-06-03 DIAGNOSIS — Z3A16 16 weeks gestation of pregnancy: Secondary | ICD-10-CM

## 2015-06-03 DIAGNOSIS — N760 Acute vaginitis: Secondary | ICD-10-CM | POA: Diagnosis not present

## 2015-06-03 DIAGNOSIS — N949 Unspecified condition associated with female genital organs and menstrual cycle: Secondary | ICD-10-CM | POA: Diagnosis not present

## 2015-06-03 DIAGNOSIS — B9689 Other specified bacterial agents as the cause of diseases classified elsewhere: Secondary | ICD-10-CM | POA: Insufficient documentation

## 2015-06-03 DIAGNOSIS — Z3A15 15 weeks gestation of pregnancy: Secondary | ICD-10-CM | POA: Diagnosis not present

## 2015-06-03 DIAGNOSIS — O9989 Other specified diseases and conditions complicating pregnancy, childbirth and the puerperium: Secondary | ICD-10-CM

## 2015-06-03 DIAGNOSIS — A499 Bacterial infection, unspecified: Secondary | ICD-10-CM

## 2015-06-03 DIAGNOSIS — O23592 Infection of other part of genital tract in pregnancy, second trimester: Secondary | ICD-10-CM | POA: Diagnosis not present

## 2015-06-03 DIAGNOSIS — O99332 Smoking (tobacco) complicating pregnancy, second trimester: Secondary | ICD-10-CM | POA: Insufficient documentation

## 2015-06-03 LAB — CBC WITH DIFFERENTIAL/PLATELET
BASOS ABS: 0 10*3/uL (ref 0.0–0.1)
BASOS PCT: 0 % (ref 0–1)
EOS ABS: 0.2 10*3/uL (ref 0.0–0.7)
EOS PCT: 2 % (ref 0–5)
HCT: 35.8 % — ABNORMAL LOW (ref 36.0–46.0)
Hemoglobin: 11.7 g/dL — ABNORMAL LOW (ref 12.0–15.0)
Lymphocytes Relative: 12 % (ref 12–46)
Lymphs Abs: 1.4 10*3/uL (ref 0.7–4.0)
MCH: 27.3 pg (ref 26.0–34.0)
MCHC: 32.7 g/dL (ref 30.0–36.0)
MCV: 83.4 fL (ref 78.0–100.0)
Monocytes Absolute: 0.4 10*3/uL (ref 0.1–1.0)
Monocytes Relative: 4 % (ref 3–12)
Neutro Abs: 9.4 10*3/uL — ABNORMAL HIGH (ref 1.7–7.7)
Neutrophils Relative %: 82 % — ABNORMAL HIGH (ref 43–77)
PLATELETS: 228 10*3/uL (ref 150–400)
RBC: 4.29 MIL/uL (ref 3.87–5.11)
RDW: 17.5 % — AB (ref 11.5–15.5)
WBC: 11.4 10*3/uL — ABNORMAL HIGH (ref 4.0–10.5)

## 2015-06-03 LAB — URINALYSIS, ROUTINE W REFLEX MICROSCOPIC
Bilirubin Urine: NEGATIVE
Glucose, UA: NEGATIVE mg/dL
Ketones, ur: NEGATIVE mg/dL
LEUKOCYTES UA: NEGATIVE
NITRITE: NEGATIVE
Protein, ur: NEGATIVE mg/dL
SPECIFIC GRAVITY, URINE: 1.015 (ref 1.005–1.030)
UROBILINOGEN UA: 0.2 mg/dL (ref 0.0–1.0)
pH: 6 (ref 5.0–8.0)

## 2015-06-03 LAB — WET PREP, GENITAL
Trich, Wet Prep: NONE SEEN
Yeast Wet Prep HPF POC: NONE SEEN

## 2015-06-03 LAB — URINE MICROSCOPIC-ADD ON

## 2015-06-03 MED ORDER — METRONIDAZOLE 500 MG PO TABS: 500.0000 mg | ORAL_TABLET | Freq: Two times a day (BID) | ORAL | Status: DC

## 2015-06-03 MED ORDER — CYCLOBENZAPRINE HCL 10 MG PO TABS: 10.0000 mg | ORAL_TABLET | Freq: Two times a day (BID) | ORAL | Status: DC | PRN

## 2015-06-03 MED ORDER — CYCLOBENZAPRINE HCL 10 MG PO TABS
10.0000 mg | ORAL_TABLET | Freq: Once | ORAL | Status: AC
  Administered 2015-06-03: 10 mg via ORAL
  Filled 2015-06-03: qty 1

## 2015-06-03 NOTE — MAU Note (Signed)
Pt presents via EMS complaining of right and left sided lower abdominal pain that is sharp and shooting that is worse with movement. Denies vaginal bleeding or discharge.

## 2015-06-03 NOTE — MAU Provider Note (Signed)
History     CSN: [id number]  Arrival date and time: 06/03/15 1204   First Provider Initiated Contact with Patient 06/03/15 1216      Chief Complaint  Patient presents with  . Abdominal Pain   HPI  [name] is a 19 y.o. [id number] at [redacted]w[redacted]d who presents to MAU today with complaint of bilateral lower abdominal pain since 0400 today. The patient states that the pain radiates into her pelvis. She has had some loose stools today. She denies N/V, UTI symptoms, hematuria, vaginal bleeding, discharge or fever. She rates pain at 7/10 now. She has not taken anything for pain because she states that Tylenol doesn't work. She denies sick contacts. She states pain is worse with laying flat and change of positions.   OB History    Gravida Para Term Preterm AB TAB SAB Ectopic Multiple Living   4 3 3       3       Past Medical History  Diagnosis Date  . Asthma   . Preeclampsia in postpartum period   . Anemia   . Pregnancy induced hypertension 2011    post partum    Past Surgical History  Procedure Laterality Date  . No past surgeries      Family History  Problem Relation Age of Onset  . Anemia Mother   . Depression Mother   . Asthma Brother   . Diabetes Maternal Grandmother   . Heart disease Paternal Grandfather     History  Substance Use Topics  . Smoking status: Current Some Day Smoker -- 0.25 packs/day    Types: Cigars, Cigarettes  . Smokeless tobacco: Never Used  . Alcohol Use: No    Allergies: No Known Allergies  Prescriptions prior to admission  Medication Sig Dispense Refill Last Dose  . albuterol (PROVENTIL HFA;VENTOLIN HFA) 108 (90 BASE) MCG/ACT inhaler Inhale 2 puffs into the lungs every 6 (six) hours as needed for wheezing or shortness of breath. 1 Inhaler 2 Not Taking  . Budesonide 90 MCG/ACT inhaler Inhale 1 puff into the lungs 2 (two) times daily. 1 Inhaler 2 Not Taking  . Fe Fum-FePoly-FA-Vit C-Vit B3 (INTEGRA F) 125-1 MG CAPS Take 1 capsule by mouth  daily. 30 capsule 3 Not Taking  . ibuprofen (ADVIL,MOTRIN) 600 MG tablet Take 1 tablet (600 mg total) by mouth every 6 (six) hours. 30 tablet 0 Not Taking  . methocarbamol (ROBAXIN) 500 MG tablet Take 1 tablet (500 mg total) by mouth 2 (two) times daily. 20 tablet 0   . oxyCODONE-acetaminophen (PERCOCET/ROXICET) 5-325 MG per tablet Take 1 tablet by mouth every 4 (four) hours as needed. 15 tablet 0   . Prenatal Vit-Fe Fumarate-FA (PRENATAL MULTIVITAMIN) TABS tablet Take 1 tablet by mouth daily at 12 noon.   Not Taking    Review of Systems  Constitutional: Negative for fever and malaise/fatigue.  Gastrointestinal: Positive for abdominal pain and diarrhea. Negative for nausea, vomiting and constipation.  Genitourinary: Negative for dysuria, urgency and frequency.       Neg - vaginal bleeding, discharge   Physical Exam   Blood pressure 112/75, pulse 73, temperature 98.3 F (36.8 C), temperature source Oral, resp. rate 18, last menstrual period 02/13/2015, not currently breastfeeding.  Physical Exam  Nursing note and vitals reviewed. Constitutional: She is oriented to person, place, and time. She appears well-developed and well-nourished. No distress.  HENT:  Head: Normocephalic and atraumatic.  Cardiovascular: Normal rate.   Respiratory: Effort normal.  GI: Soft. She exhibits  no distension and no mass. There is tenderness (mild tenderness to palpation of the suprapubic region bilaterally). There is no rebound and no guarding.  Genitourinary: Uterus is enlarged (apprropriate for GA). Uterus is not tender. Cervix exhibits no motion tenderness, no discharge and no friability. No bleeding in the vagina. Vaginal discharge (moderate amount of thin, white discharge noted) found.  Cervix: closed, thick  Neurological: She is alert and oriented to person, place, and time.  Skin: Skin is warm and dry. No erythema.  Psychiatric: She has a normal mood and affect.   Results for orders placed or  performed during the hospital encounter of 06/03/15 (from the past 24 hour(s))  Urinalysis, Routine w reflex microscopic ([facility])     Status: Abnormal   Collection Time: 06/03/15 12:10 PM  Result Value Ref Range   Color, Urine YELLOW YELLOW   APPearance CLEAR CLEAR   Specific Gravity, Urine 1.015 1.005 - 1.030   pH 6.0 5.0 - 8.0   Glucose, UA NEGATIVE NEGATIVE mg/dL   Hgb urine dipstick LARGE (A) NEGATIVE   Bilirubin Urine NEGATIVE NEGATIVE   Ketones, ur NEGATIVE NEGATIVE mg/dL   Protein, ur NEGATIVE NEGATIVE mg/dL   Urobilinogen, UA 0.2 0.0 - 1.0 mg/dL   Nitrite NEGATIVE NEGATIVE   Leukocytes, UA NEGATIVE NEGATIVE  Urine microscopic-add on     Status: None   Collection Time: 06/03/15 12:10 PM  Result Value Ref Range   Squamous Epithelial / LPF RARE RARE   RBC / HPF 11-20 <3 RBC/hpf  Wet prep, genital     Status: Abnormal   Collection Time: 06/03/15 12:25 PM  Result Value Ref Range   Yeast Wet Prep HPF POC NONE SEEN NONE SEEN   Trich, Wet Prep NONE SEEN NONE SEEN   Clue Cells Wet Prep HPF POC MODERATE (A) NONE SEEN   WBC, Wet Prep HPF POC MODERATE (A) NONE SEEN  CBC with Differential/Platelet     Status: Abnormal   Collection Time: 06/03/15 12:50 PM  Result Value Ref Range   WBC 11.4 (H) 4.0 - 10.5 K/uL   RBC 4.29 3.87 - 5.11 MIL/uL   Hemoglobin 11.7 (L) 12.0 - 15.0 g/dL   HCT 13.035.8 (L) 86.536.0 - 78.446.0 %   MCV 83.4 78.0 - 100.0 fL   MCH 27.3 26.0 - 34.0 pg   MCHC 32.7 30.0 - 36.0 g/dL   RDW 69.617.5 (H) 29.511.5 - 28.415.5 %   Platelets 228 150 - 400 K/uL   Neutrophils Relative % 82 (H) 43 - 77 %   Neutro Abs 9.4 (H) 1.7 - 7.7 K/uL   Lymphocytes Relative 12 12 - 46 %   Lymphs Abs 1.4 0.7 - 4.0 K/uL   Monocytes Relative 4 3 - 12 %   Monocytes Absolute 0.4 0.1 - 1.0 K/uL   Eosinophils Relative 2 0 - 5 %   Eosinophils Absolute 0.2 0.0 - 0.7 K/uL   Basophils Relative 0 0 - 1 %   Basophils Absolute 0.0 0.0 - 0.1 K/uL    MAU Course  Procedures None  MDM FHR - 150 bpm with  doppler UA, wet prep, GC/Chlamydia today Flexeril given in MAU. Patient reports some improvement in pain.  Urine culture pending Assessment and Plan  A: SIUP at 1135w5d Round ligament pain Bacterial vaginosis  P: Discharge home Rx for Flexeril and Flagyl given to patient Second trimester precautions discussed Patient advised [facility] to start prenatal care Patient may return  to MAU as needed or if her condition were to change or worsen  Marny Lowenstein, PA-C  06/03/2015, 1:09 PM

## 2015-06-03 NOTE — Discharge Instructions (Signed)
Bacterial Vaginosis Bacterial vaginosis is an infection of the vagina. It happens when too many of certain germs (bacteria) grow in the vagina. HOME CARE  Take your medicine as told by your doctor.  Finish your medicine even if you start to feel better.  Do not have sex until you finish your medicine and are better.  Tell your sex partner that you have an infection. They should see their doctor for treatment.  Practice safe sex. Use condoms. Have only one sex partner. GET HELP IF:  You are not getting better after 3 days of treatment.  You have more grey fluid (discharge) coming from your vagina than before.  You have more pain than before.  You have a fever. MAKE SURE YOU:   Understand these instructions.  Will watch your condition.  Will get help right away if you are not doing well or get worse. Document Released: 08/13/2008 Document Revised: 08/25/2013 Document Reviewed: 06/16/2013 [facility] Patient Information 2015 [place], [place]. This information is not intended to replace advice given to you by your health care provider. Make sure you discuss any questions you have with your health care provider.   Round Ligament Pain During Pregnancy   Round ligament pain is a sharp pain or jabbing feeling often felt in the lower belly or groin area on one or both sides. It is one of the most common complaints during pregnancy and is considered a normal part of pregnancy. It is most often felt during the second trimester.   Here is what you need to know about round ligament pain, including some tips to help you feel better.   Causes of Round Ligament Pain:    Several thick ligaments surround and support your womb (uterus) as it grows during pregnancy. One of them is called the round ligament.   The round ligament connects the front part of the womb to your groin, the area where your legs attach to your pelvis. The round ligament normally tightens and relaxes slowly.   As your baby  and womb grow, the round ligament stretches. That makes it more likely to become strained.   Sudden movements can cause the ligament to tighten quickly, like a rubber band snapping. This causes a sudden and quick jabbing feeling.   Symptoms of Round Ligament Pain   Round ligament pain can be concerning and uncomfortable. But it is considered normal as your body changes during pregnancy.   The symptoms of round ligament pain include a sharp, sudden spasm in the belly. It usually affects the right side, but it may happen on both sides. The pain only lasts a few seconds.   Exercise may cause the pain, as will rapid movements such as:   sneezing  coughing  laughing  rolling over in bed  standing up too quickly   Treatment of Round Ligament Pain   Here are some tips that may help reduce your discomfort:   Pain relief. Take over-the-counter acetaminophen for pain, if necessary. Ask your doctor if this is OK.   Exercise. Get plenty of exercise to keep your stomach (core) muscles strong. Doing stretching exercises or prenatal yoga can be helpful. Ask your doctor which exercises are safe for you and your baby.   A helpful exercise involves putting your hands and knees on the floor, lowering your head, and pushing your backside into the air.   Avoid sudden movements. Change positions slowly (such as standing up or sitting down) to avoid sudden movements that may cause stretching  and pain.   Flex your hips. Bend and flex your hips before you cough, sneeze, or laugh to avoid pulling on the ligaments.   Apply warmth. A heating pad or warm bath may be helpful. Ask your doctor if this is OK. Extreme heat can be dangerous to the baby.   You should try to modify your daily activity level and avoid positions that may worsen the condition.   When to Call the Doctor/Midwife   Always tell your doctor or midwife about any type of pain you have during pregnancy. Round ligament pain is quick and  doesn't last long.   Call your health care provider immediately if you have:   severe pain  fever  chills  pain on urination  difficulty walking   Belly pain during pregnancy can be due to many different causes. It is important for your doctor to rule out more serious conditions, including pregnancy complications such as placenta abruption or non-pregnancy illnesses such as:   inguinal hernia  appendicitis  stomach, liver, and kidney problems  Preterm labor pains may sometimes be mistaken for round ligament pain.

## 2015-06-04 LAB — RPR: RPR: NONREACTIVE

## 2015-06-04 LAB — HIV ANTIBODY (ROUTINE TESTING W REFLEX): HIV Screen 4th Generation wRfx: NONREACTIVE

## 2015-06-05 LAB — CULTURE, OB URINE: Culture: NO GROWTH

## 2015-06-05 LAB — GC/CHLAMYDIA PROBE AMP (~~LOC~~) NOT AT ARMC
Chlamydia: NEGATIVE
Neisseria Gonorrhea: NEGATIVE

## 2015-06-16 ENCOUNTER — Inpatient Hospital Stay (HOSPITAL_COMMUNITY)
Admission: AD | Admit: 2015-06-16 | Discharge: 2015-06-16 | Disposition: A | Discharge status: Home / Self Care | Payer: Medicaid Other | Source: Ambulatory Visit | Attending: Family Medicine | Admitting: Family Medicine

## 2015-06-16 ENCOUNTER — Encounter (HOSPITAL_COMMUNITY): Payer: Self-pay

## 2015-06-16 DIAGNOSIS — R102 Pelvic and perineal pain: Secondary | ICD-10-CM | POA: Insufficient documentation

## 2015-06-16 DIAGNOSIS — N949 Unspecified condition associated with female genital organs and menstrual cycle: Secondary | ICD-10-CM

## 2015-06-16 DIAGNOSIS — O99332 Smoking (tobacco) complicating pregnancy, second trimester: Secondary | ICD-10-CM | POA: Diagnosis not present

## 2015-06-16 DIAGNOSIS — R109 Unspecified abdominal pain: Secondary | ICD-10-CM | POA: Diagnosis present

## 2015-06-16 DIAGNOSIS — Z3A17 17 weeks gestation of pregnancy: Secondary | ICD-10-CM | POA: Diagnosis not present

## 2015-06-16 DIAGNOSIS — O9989 Other specified diseases and conditions complicating pregnancy, childbirth and the puerperium: Secondary | ICD-10-CM

## 2015-06-16 DIAGNOSIS — F17210 Nicotine dependence, cigarettes, uncomplicated: Secondary | ICD-10-CM | POA: Insufficient documentation

## 2015-06-16 DIAGNOSIS — O26899 Other specified pregnancy related conditions, unspecified trimester: Secondary | ICD-10-CM

## 2015-06-16 LAB — URINALYSIS, ROUTINE W REFLEX MICROSCOPIC
BILIRUBIN URINE: NEGATIVE
Glucose, UA: NEGATIVE mg/dL
Hgb urine dipstick: NEGATIVE
Ketones, ur: NEGATIVE mg/dL
Leukocytes, UA: NEGATIVE
Nitrite: NEGATIVE
PH: 6.5 (ref 5.0–8.0)
Protein, ur: NEGATIVE mg/dL
Specific Gravity, Urine: 1.025 (ref 1.005–1.030)
Urobilinogen, UA: 0.2 mg/dL (ref 0.0–1.0)

## 2015-06-16 MED ORDER — CONCEPT OB 130-92.4-1 MG PO CAPS: 1.0000 | ORAL_CAPSULE | Freq: Every day | ORAL | Status: DC

## 2015-06-16 NOTE — MAU Note (Addendum)
Pain in bil lower abd. Does not know how far pregnant. Has not felt baby move yet and is concerned. Denies bleeding. Recently treated for BV from MAU. Before finding out was pregnant did drink on wkends and smoke black and milds.

## 2015-06-16 NOTE — Discharge Instructions (Signed)

## 2015-06-16 NOTE — MAU Provider Note (Signed)
History     CSN: [id number]  Arrival date and time: 06/16/15 1916   First Provider Initiated Contact with Patient 06/16/15 2026     Chief Complaint  Patient presents with  . Abdominal Pain   Abdominal Pain This is a recurrent problem. The current episode started 1 to 4 weeks ago. The onset quality is undetermined. The problem occurs intermittently. The problem has been unchanged. The pain is located in the LLQ and RLQ. The pain is at a severity of 5/10. The quality of the pain is dull (pulling ). The abdominal pain does not radiate. Pertinent negatives include no constipation, diarrhea, dysuria, fever, frequency, hematuria, myalgias, nausea or vomiting. The pain is aggravated by movement. The pain is relieved by being still and recumbency. She has tried nothing for the symptoms. Prior workup: pelvic exam, GC/Chlamydia cultures, UA.  There is no history of abdominal surgery, Crohn's disease or irritable bowel syndrome.   Has been in seen in MAU 05/31/2015 for pregnancy verification and 06/03/2015 for abdominal pain that was diagnosed as from ligament pain. She is concerned because she had an uncertain LMP is not sure how far along she really is. She is also concerned because she drank and smoke in April 2016 and is worried that it may have been dangerous for the baby. States she has not used any alcohol, tobacco or drugs since then. Plans to get prenatal care at [facility]. First appointment scheduled for 06/29/2015.  OB History    Gravida Para Term Preterm AB TAB SAB Ectopic Multiple Living   Past Medical History  Diagnosis Date  . Asthma   . Preeclampsia in postpartum period   . Anemia   . Pregnancy induced hypertension 2011    post partum    Past Surgical History  Procedure Laterality Date  . No past surgeries      Family History  Problem Relation Age of Onset  . Anemia Mother   . Depression Mother   . Asthma Brother   . Diabetes Maternal  Grandmother   . Heart disease Paternal Grandfather     History  Substance Use Topics  . Smoking status: Current Some Day Smoker -- 0.25 packs/day    Types: Cigars, Cigarettes  . Smokeless tobacco: Never Used  . Alcohol Use: No    Allergies: No Known Allergies  Prescriptions prior to admission  Medication Sig Dispense Refill Last Dose  . albuterol (PROVENTIL HFA;VENTOLIN HFA) 108 (90 BASE) MCG/ACT inhaler Inhale 2 puffs into the lungs every 6 (six) hours as needed for wheezing or shortness of breath. 1 Inhaler 2 rescue  . cyclobenzaprine (FLEXERIL) 10 MG tablet Take 1 tablet (10 mg total) by mouth 2 (two) times daily as needed for muscle spasms. 20 tablet 0 prn  . [DISCONTINUED] Budesonide 90 MCG/ACT inhaler Inhale 1 puff into the lungs 2 (two) times daily. (Patient not taking: Reported on 06/16/2015) 1 Inhaler 2 Not Taking at Unknown time  . [DISCONTINUED] Fe Fum-FePoly-FA-Vit C-Vit B3 (INTEGRA F) 125-1 MG CAPS Take 1 capsule by mouth daily. (Patient not taking: Reported on 06/16/2015) 30 capsule 3 Not Taking at Unknown time    Review of Systems  Constitutional: Negative for fever and chills.  Gastrointestinal: Positive for abdominal pain. Negative for nausea, vomiting, diarrhea and constipation.  Genitourinary: Negative for dysuria, urgency, frequency and hematuria.       Negative for vaginal bleeding, vaginal discharge or  leaking of fluid. Has not experienced quickening.  Musculoskeletal: Negative for myalgias, back pain and falls.   Physical Exam   Blood pressure 126/64, pulse 97, temperature 98.7 F (37.1 C), resp. rate 18, height 5\' 2"  (1.575 m), weight 122 lb 9.6 oz (55.611 kg), last menstrual period 02/13/2015, not currently breastfeeding.  Physical Exam  Nursing note and vitals reviewed. Constitutional: She appears well-developed and well-nourished. No distress.  Cardiovascular: Normal rate.   Respiratory: Effort normal. No respiratory distress.  GI: Soft. Bowel sounds  are normal. She exhibits no distension and no mass. There is no tenderness. There is no rebound and no guarding.  Fundal height 18 cm.  Genitourinary: Vagina normal. There is no rash or lesion on the right labia. There is no rash or lesion on the left labia. Uterus is enlarged. Uterus is not tender. Cervix exhibits no motion tenderness, no discharge and no friability. Right adnexum displays no mass, no tenderness and no fullness. Left adnexum displays no mass, no tenderness and no fullness. No erythema, tenderness or bleeding in the vagina. No vaginal discharge found.   Results for orders placed or performed during the hospital encounter of 06/16/15 (from the past 72 hour(s))  Urinalysis, Routine w reflex microscopic (not at [facility])     Status: None   Collection Time: 06/16/15  7:20 PM  Result Value Ref Range   Color, Urine YELLOW YELLOW   APPearance CLEAR CLEAR   Specific Gravity, Urine 1.025 1.005 - 1.030   pH 6.5 5.0 - 8.0   Glucose, UA NEGATIVE NEGATIVE mg/dL   Hgb urine dipstick NEGATIVE NEGATIVE   Bilirubin Urine NEGATIVE NEGATIVE   Ketones, ur NEGATIVE NEGATIVE mg/dL   Protein, ur NEGATIVE NEGATIVE mg/dL   Urobilinogen, UA 0.2 0.0 - 1.0 mg/dL   Nitrite NEGATIVE NEGATIVE   Leukocytes, UA NEGATIVE NEGATIVE    Comment: MICROSCOPIC NOT DONE ON URINES WITH NEGATIVE PROTEIN, BLOOD, LEUKOCYTES, NITRITE, OR GLUCOSE <1000 mg/dL.   Informal bedside ultrasound shows active fetus. Fetal heart rate 152. HC C/W 17.2 weeks. Femur length C/W 17 weeks. Explained that this is not a dating ultrasound, but does correlate well with gestational age based on last menstrual period.  MAU Course  Procedures Pelvic exam. No cultures collected due to recent negative cultures and recent treatment for BV with resolution of symptoms.  MDM Cervix long and closed. Neg UA and recent cultures. Pain C/W RLP. Pt mostly seems concerned w/ not have started [facility] yet. Reassured her that ETOH and tobacco exposure where  limited and too early to be likely to have caused and pregnancy problems. Instructed her to continue avoiding them.   Assessment and Plan   1. Pain of round ligament affecting pregnancy, antepartum    Discharge home in stable condition. Comfort measures. Second trimester precautions reviewed. Follow-up Information    Follow up with Almon Hercules., MD On 06/29/2015.   Specialty:  Obstetrics and Gynecology   Why:  Start prenatal care   Contact information:   [address] [place] [address] [phone]       Follow up with [facility] [place] [facility]ERN[facility] ADMISSIONS.   Why:  As needed in emergencies   Contact information:   [address] [id number] mc [place] [address] [phone]       Medication List    TAKE these medications        albuterol 108 (90 BASE) MCG/ACT inhaler  Commonly known as:  PROVENTIL HFA;VENTOLIN HFA  Inhale  2 puffs into the lungs every 6 (six) hours as needed for wheezing or shortness of breath.     CONCEPT OB 130-92.4-1 MG Caps  Take 1 tablet by mouth daily.     cyclobenzaprine 10 MG tablet  Commonly known as:  FLEXERIL  Take 1 tablet (10 mg total) by mouth 2 (two) times daily as needed for muscle spasms.        Dorathy Kinsman 06/16/2015, 9:11 PM

## 2015-06-29 ENCOUNTER — Other Ambulatory Visit: Payer: Self-pay

## 2015-06-30 LAB — CYTOLOGY - PAP

## 2015-07-01 IMAGING — CR DG CHEST 2V
2 series · 2 of 2 positions shown · non-contrast
Comparison: None.

CLINICAL DATA: Asthma, shortness of breath

EXAM:
CHEST  2 VIEW

[view not recorded (1 of 2)]
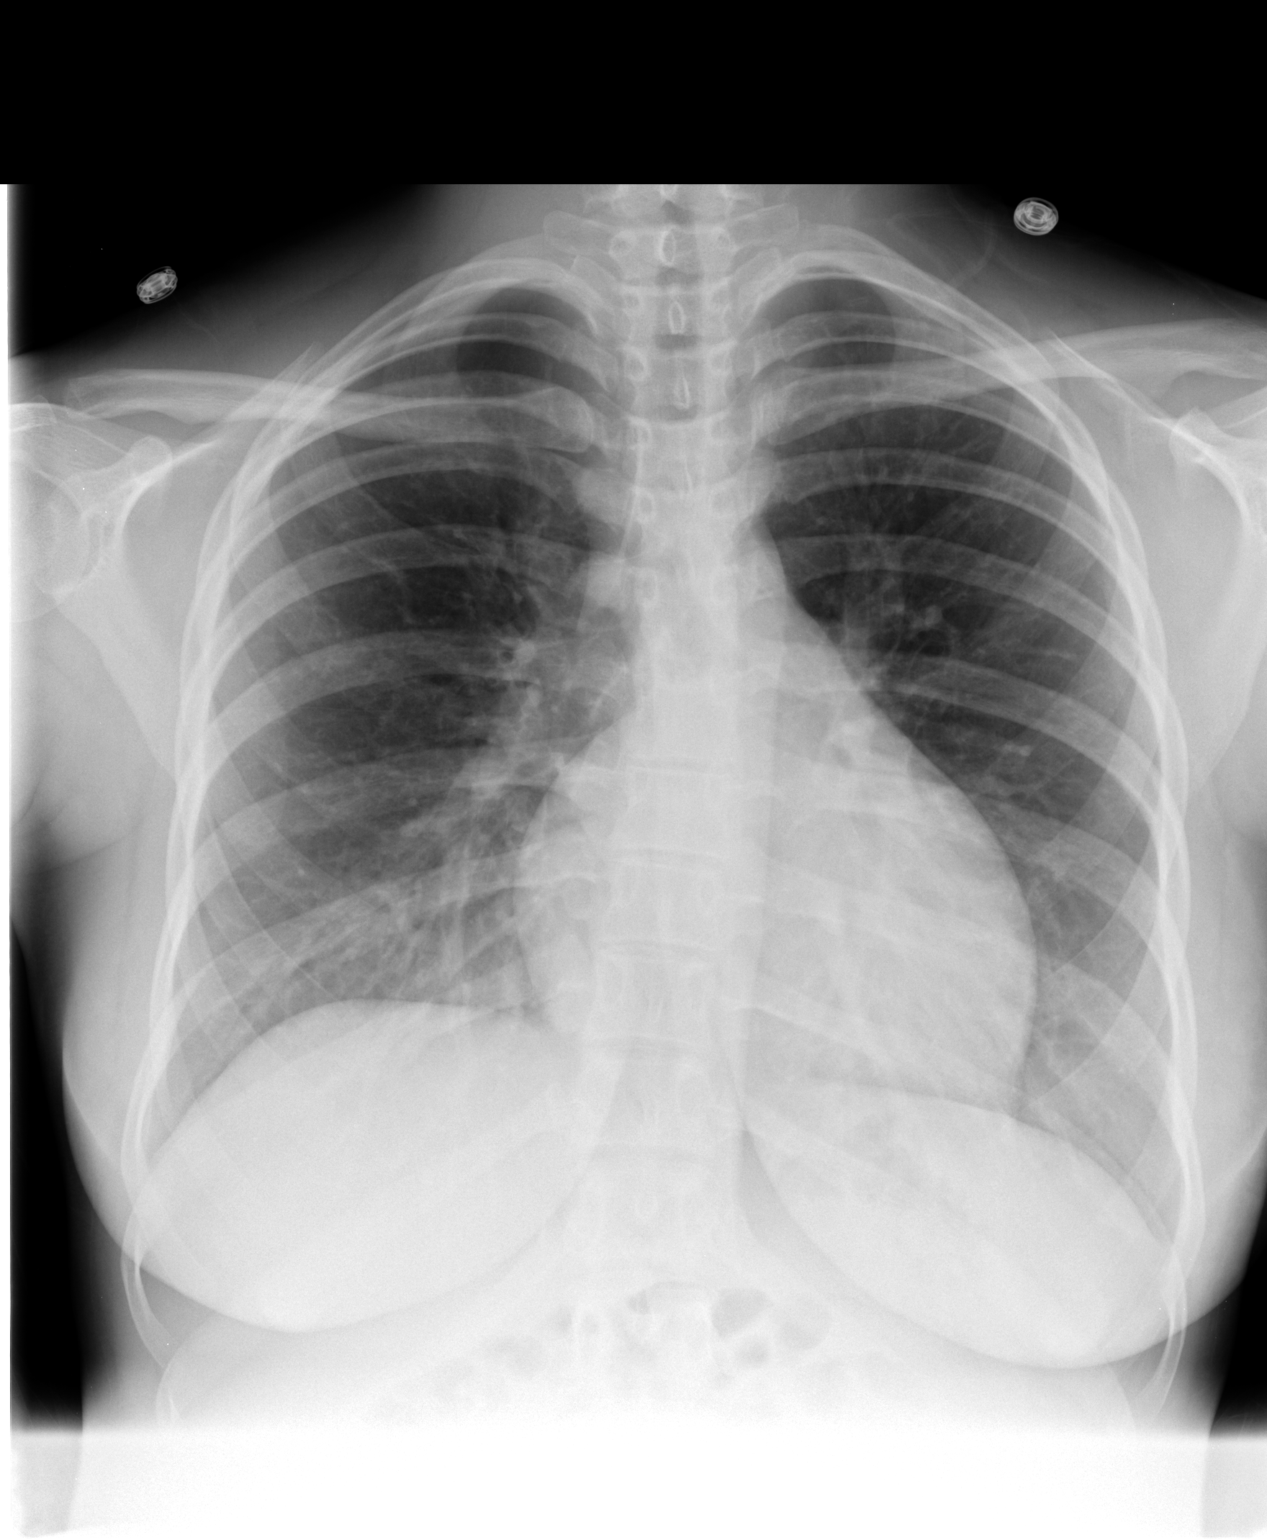

[view not recorded (2 of 2)]
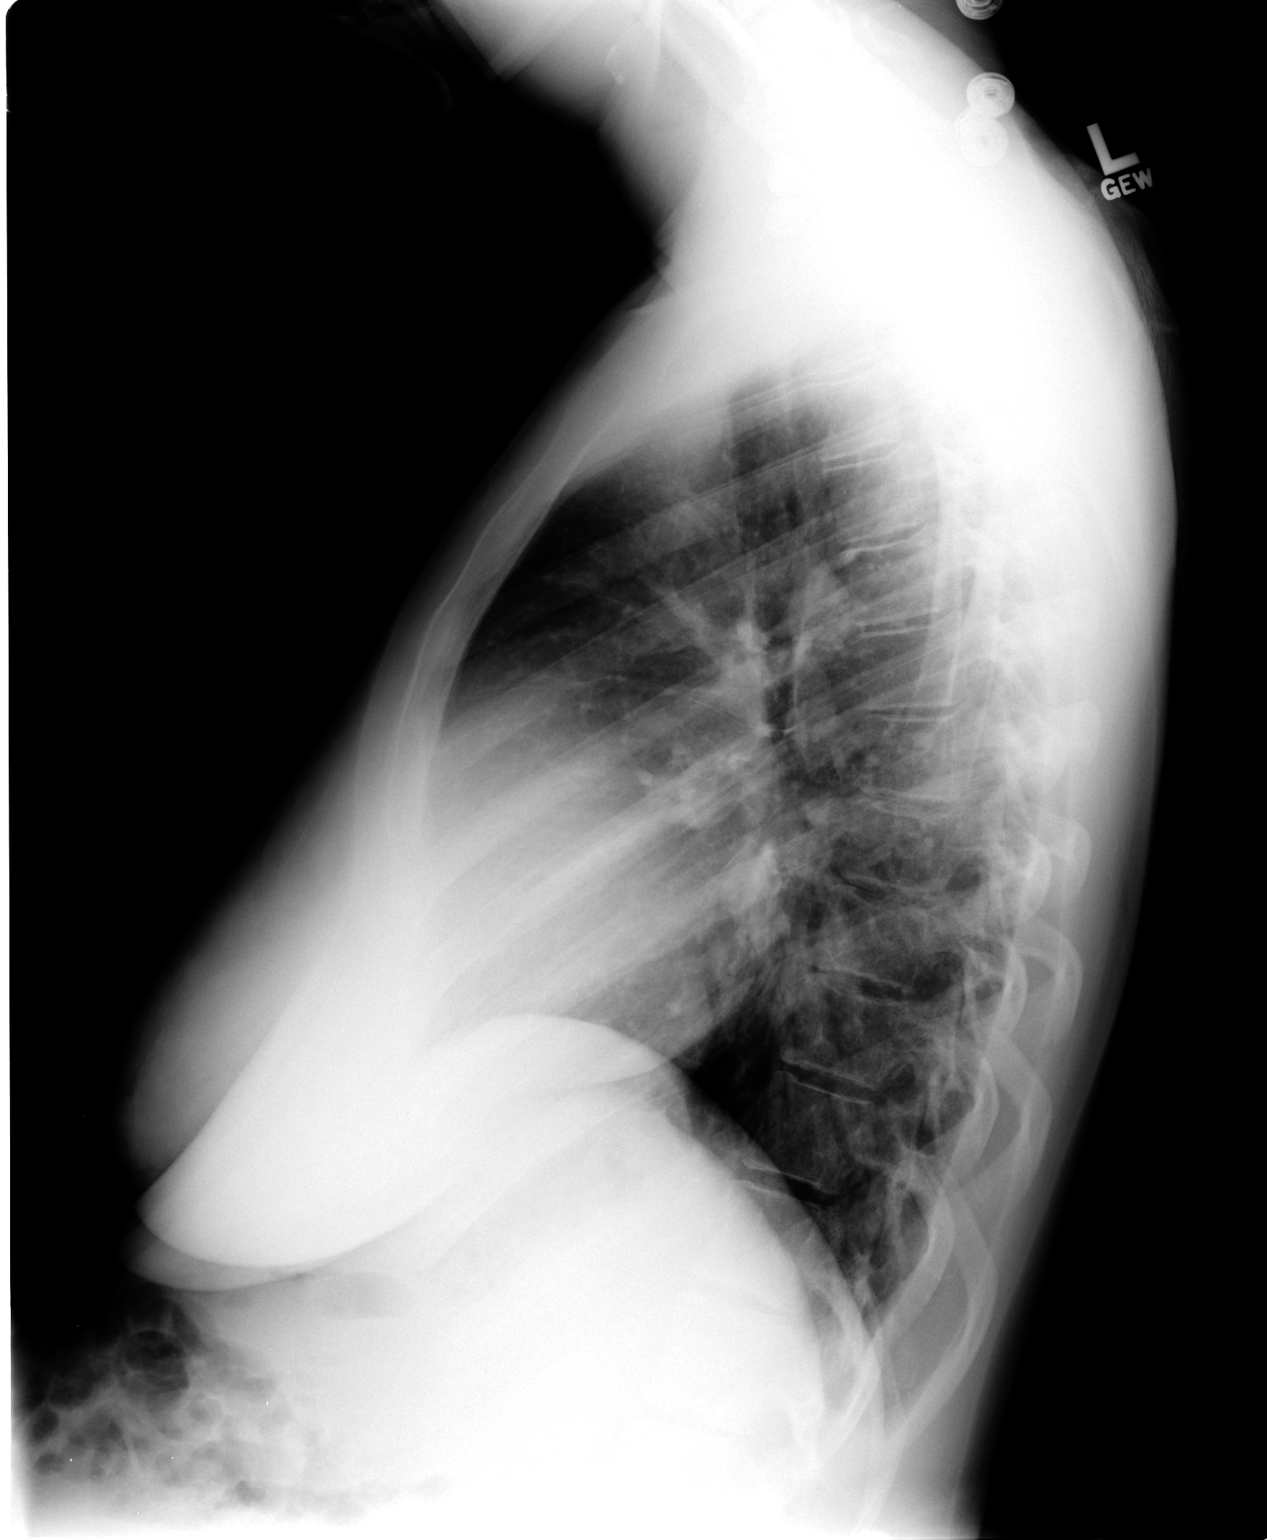

[2 of 2 positions shown; findings below may reference images not displayed]

FINDINGS: The heart size and mediastinal contours are within normal limits.
Both lungs are clear. The visualized skeletal structures are
unremarkable.
IMPRESSION: No active cardiopulmonary disease.

## 2015-11-09 ENCOUNTER — Ambulatory Visit: Payer: Medicaid Other | Admitting: Certified Nurse Midwife

## 2015-11-09 ENCOUNTER — Encounter: Payer: Self-pay | Admitting: Certified Nurse Midwife

## 2015-11-09 VITALS — BP 108/75 | HR 82 | Ht 63.0 in | Wt 122.0 lb

## 2015-11-09 DIAGNOSIS — Z01419 Encounter for gynecological examination (general) (routine) without abnormal findings: Secondary | ICD-10-CM

## 2015-11-09 NOTE — Progress Notes (Deleted)
Patient ID: Yvonne Castillo, female   DOB: 1996/03/16, 19 y.o.   MRN: [id number]  Subjective:    Yvonne Castillo is a 19 y.o. female being seen today for her obstetrical visit. She is at 4963w1d gestation. Patient reports: no complaints . Fetal movement: normal.  Problem List Items Addressed This Visit    None    Visit Diagnoses    Encounter for routine gynecological examination    -  Primary    Relevant Orders    Pap IG w/ reflex to HPV when ASC-U    SureSwab, Vaginosis/Vaginitis Plus      Patient Active Problem List   Diagnosis Date Noted  . Active labor at term 05/11/2014  . Hx of preeclampsia, prior pregnancy, currently pregnant 03/25/2014  . Supervision of normal pregnancy 03/24/2014  . Anemia during pregnancy 03/24/2014  . Asthma 03/21/2014  . Nephrolithiasis 02/18/2014   Objective:    BP 108/75 mmHg  Pulse 82  Ht 5\' 3"  (1.6 m)  Wt 122 lb (55.339 kg)  BMI 21.62 kg/m2  LMP 10/19/2015  Breastfeeding? No FHT: 150 BPM  Uterine Size: size equals dates     Assessment:    Pregnancy @ 6363w1d    Plan:    OBGCT: discussed. Signs and symptoms of preterm labor: discussed.  Labs, problem list reviewed and updated 2 hr GTT planned Follow up in 4 weeks.

## 2015-11-10 ENCOUNTER — Ambulatory Visit (INDEPENDENT_AMBULATORY_CARE_PROVIDER_SITE_OTHER): Payer: Medicaid Other | Admitting: Certified Nurse Midwife

## 2015-11-10 ENCOUNTER — Encounter: Payer: Self-pay | Admitting: Certified Nurse Midwife

## 2015-11-10 DIAGNOSIS — Z01419 Encounter for gynecological examination (general) (routine) without abnormal findings: Secondary | ICD-10-CM | POA: Diagnosis not present

## 2015-11-10 DIAGNOSIS — Z0000 Encounter for general adult medical examination without abnormal findings: Secondary | ICD-10-CM | POA: Diagnosis not present

## 2015-11-10 DIAGNOSIS — N361 Urethral diverticulum: Secondary | ICD-10-CM

## 2015-11-10 DIAGNOSIS — Z113 Encounter for screening for infections with a predominantly sexual mode of transmission: Secondary | ICD-10-CM

## 2015-11-10 DIAGNOSIS — Z30011 Encounter for initial prescription of contraceptive pills: Secondary | ICD-10-CM

## 2015-11-10 LAB — COMPREHENSIVE METABOLIC PANEL
ALBUMIN: 4.5 g/dL (ref 3.6–5.1)
ALT: 8 U/L (ref 6–29)
AST: 14 U/L (ref 10–30)
Alkaline Phosphatase: 59 U/L (ref 33–115)
BILIRUBIN TOTAL: 0.5 mg/dL (ref 0.2–1.2)
BUN: 8 mg/dL (ref 7–25)
CALCIUM: 9.6 mg/dL (ref 8.6–10.2)
CO2: 23 mmol/L (ref 20–31)
CREATININE: 0.7 mg/dL (ref 0.50–1.10)
Chloride: 107 mmol/L (ref 98–110)
Glucose, Bld: 81 mg/dL (ref 65–99)
Potassium: 4.1 mmol/L (ref 3.5–5.3)
Sodium: 140 mmol/L (ref 135–146)
TOTAL PROTEIN: 7.2 g/dL (ref 6.1–8.1)

## 2015-11-10 LAB — CBC WITH DIFFERENTIAL/PLATELET
Basophils Absolute: 0 10*3/uL (ref 0.0–0.1)
Basophils Relative: 0 % (ref 0–1)
Eosinophils Absolute: 0.3 10*3/uL (ref 0.0–0.7)
Eosinophils Relative: 4 % (ref 0–5)
HEMATOCRIT: 42 % (ref 36.0–46.0)
Hemoglobin: 14.2 g/dL (ref 12.0–15.0)
LYMPHS PCT: 26 % (ref 12–46)
Lymphs Abs: 1.8 10*3/uL (ref 0.7–4.0)
MCH: 28.8 pg (ref 26.0–34.0)
MCHC: 33.8 g/dL (ref 30.0–36.0)
MCV: 85.2 fL (ref 78.0–100.0)
MONO ABS: 0.3 10*3/uL (ref 0.1–1.0)
MONOS PCT: 5 % (ref 3–12)
MPV: 11.2 fL (ref 8.6–12.4)
NEUTROS ABS: 4.5 10*3/uL (ref 1.7–7.7)
Neutrophils Relative %: 65 % (ref 43–77)
Platelets: 231 10*3/uL (ref 150–400)
RBC: 4.93 MIL/uL (ref 3.87–5.11)
RDW: 17.2 % — ABNORMAL HIGH (ref 11.5–15.5)
WBC: 6.9 10*3/uL (ref 4.0–10.5)

## 2015-11-10 LAB — TSH: TSH: 0.69 u[IU]/mL (ref 0.350–4.500)

## 2015-11-10 MED ORDER — LEVONORGEST-ETH ESTRAD 91-DAY 0.15-0.03 &0.01 MG PO TABS: 1.0000 | ORAL_TABLET | Freq: Every day | ORAL | Status: DC

## 2015-11-10 NOTE — Progress Notes (Signed)
Rescheduled, did not stay for exam.

## 2015-11-10 NOTE — Progress Notes (Signed)
Patient ID: Yvonne Castillo, female   DOB: [birth year], 19 y.o.   MRN: [id number]    Subjective:      Yvonne Castillo is a 19 y.o. female here for a routine exam.  Current complaints: none.  Not currently on birth control.  Has tried Depo injections, with consistent bleeding.  OCPs has extended cycles.  Smoker: about 2 blacks/day.  Has had a recent fetal loss at 23 weeks, stillborn.  Has been staying in the house outside of work.  Sexually active currently.  LMP: Start of December.    Desires full std screening.  Has had a vaginal bump, not from shaving in her vaginal area that has gotten worse over the last 3 months, no hx of sexually transmitted disease.    Personal health questionnaire:  Is patient Ashkenazi Jewish, have a family history of breast and/or ovarian cancer: no Is there a family history of uterine cancer diagnosed at age < 48, gastrointestinal cancer, urinary tract cancer, family member who is a Personnel officer syndrome-associated carrier: no Is the patient overweight and hypertensive, family history of diabetes, personal history of gestational diabetes, preeclampsia or PCOS: no Is patient over 3, have PCOS,  family history of premature CHD under age 47, diabetes, smoke, have hypertension or peripheral artery disease:  Yes: MGF: CVA, & MI MGM: DM At any time, has a partner hit, kicked or otherwise hurt or frightened you?: no Over the past 2 weeks, have you felt down, depressed or hopeless?: no Over the past 2 weeks, have you felt little interest or pleasure in doing things?:no   Gynecologic History Patient's last menstrual period was 10/19/2015. Contraception: none Last Pap: none.  Last mammogram: N/A.   Obstetric History OB History  Gravida Para Term Preterm AB SAB TAB Ectopic Multiple Living  # Outcome Date GA Lbr Len/2nd Weight Sex Delivery Anes PTL Lv  4 Preterm 07/25/15 [redacted]w[redacted]d   M   Y ND  3 Term 05/12/14 [redacted]w[redacted]d 14:40 / 00:04 7 lb 4.4 oz (3.3 kg) F Vag-Spont EPI  Y  2  Term 05/18/13 104w0d  8 lb (3.629 kg) F Vag-Spont EPI  Y     Comments: PPH; retained placenta  1 Term 03/13/10 [redacted]w[redacted]d  7 lb 12 oz (3.515 kg) F Vag-Spont EPI  Y     Comments: Postpartume Prex; hospitalized on Mag, elevated liver enzymes, severe range pressures.      Past Medical History  Diagnosis Date  . Asthma   . Preeclampsia in postpartum period   . Anemia   . Pregnancy induced hypertension 2011    post partum    Past Surgical History  Procedure Laterality Date  . No past surgeries       Current outpatient prescriptions:  .  albuterol (PROVENTIL HFA;VENTOLIN HFA) 108 (90 BASE) MCG/ACT inhaler, Inhale 2 puffs into the lungs every 6 (six) hours as needed for wheezing or shortness of breath. (Patient not taking: Reported on 11/09/2015), Disp: 1 Inhaler, Rfl: 2 .  cyclobenzaprine (FLEXERIL) 10 MG tablet, Take 1 tablet (10 mg total) by mouth 2 (two) times daily as needed for muscle spasms. (Patient not taking: Reported on 11/09/2015), Disp: 20 tablet, Rfl: 0 .  Prenat w/o A Vit-FeFum-FePo-FA (CONCEPT OB) 130-92.4-1 MG CAPS, Take 1 tablet by mouth daily. (Patient not taking: Reported on 11/09/2015), Disp: 30 capsule, Rfl: 12 No Known Allergies  Social History  Substance Use Topics  . Smoking status: Current  Some Day Smoker -- 0.25 packs/day    Types: Cigars, Cigarettes  . Smokeless tobacco: Never Used  . Alcohol Use: 0.0 oz/week    0 Standard drinks or equivalent per week     Comment: occ    Family History  Problem Relation Age of Onset  . Anemia Mother   . Depression Mother   . Asthma Brother   . Diabetes Maternal Grandmother   . Heart disease Paternal Grandfather       Review of Systems  Constitutional: negative for fatigue and weight loss Respiratory: negative for cough and wheezing Cardiovascular: negative for chest pain, fatigue and palpitations Gastrointestinal: negative for abdominal pain and change in bowel habits Musculoskeletal:negative for  myalgias Neurological: negative for gait problems and tremors Behavioral/Psych: negative for abusive relationship, depression Endocrine: negative for temperature intolerance   Genitourinary:negative for abnormal menstrual periods, genital lesions, hot flashes, sexual problems and vaginal discharge Integument/breast: negative for breast lump, breast tenderness, nipple discharge and skin lesion(s)    Objective:       LMP 10/19/2015 General:   alert  Skin:   no rash or abnormalities  Lungs:   clear to auscultation bilaterally  Heart:   regular rate and rhythm, S1, S2 normal, no murmur, click, rub or gallop  Breasts:   normal without suspicious masses, skin or nipple changes or axillary nodes  Abdomen:  normal findings: no organomegaly, soft, non-tender and no hernia  Pelvis:  External genitalia: normal general appearance Urinary system: urethral meatus normal and bladder without fullness, non-tender.  + cyst suburerethral before vaginal introitus about 2 cm in size, tender to palpation.   Vaginal: normal without tenderness, induration or masses Cervix: normal appearance Adnexa: normal bimanual exam Uterus: anteverted and non-tender, normal size   Lab Review Urine pregnancy test Labs reviewed yes Radiologic studies reviewed no  50% of 30 min visit spent on counseling and coordination of care.   Assessment:    Healthy female exam.   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 Contraception: OCP (estrogen/progesterone). Follow up in: 1 year.   No orders of the defined types were placed in this encounter.   No orders of the defined types were placed in this encounter.   Need to obtain previous records Possible management options include:  urogynecology referral, Nuva ring Follow up as needed or in 1 year.

## 2015-11-11 LAB — HIV ANTIBODY (ROUTINE TESTING W REFLEX): HIV 1&2 Ab, 4th Generation: NONREACTIVE

## 2015-11-11 LAB — HEPATITIS B SURFACE ANTIGEN: HEP B S AG: NEGATIVE

## 2015-11-11 LAB — RPR

## 2015-11-11 LAB — HEPATITIS C ANTIBODY: HCV Ab: NEGATIVE

## 2015-11-14 ENCOUNTER — Telehealth: Payer: Self-pay

## 2015-11-14 LAB — PAP IG W/ RFLX HPV ASCU

## 2015-11-14 NOTE — Telephone Encounter (Signed)
Called patient regarding [facility] - left vm for her to call back concerning appts.

## 2015-11-15 ENCOUNTER — Other Ambulatory Visit: Payer: Self-pay | Admitting: Certified Nurse Midwife

## 2015-11-15 DIAGNOSIS — N760 Acute vaginitis: Principal | ICD-10-CM

## 2015-11-15 DIAGNOSIS — B9689 Other specified bacterial agents as the cause of diseases classified elsewhere: Secondary | ICD-10-CM

## 2015-11-15 MED ORDER — METRONIDAZOLE 500 MG PO TABS: 500.0000 mg | ORAL_TABLET | Freq: Two times a day (BID) | ORAL | Status: DC

## 2015-11-16 ENCOUNTER — Telehealth: Payer: Self-pay

## 2015-11-16 ENCOUNTER — Other Ambulatory Visit: Payer: Self-pay | Admitting: Certified Nurse Midwife

## 2015-11-16 LAB — SURESWAB, VAGINOSIS/VAGINITIS PLUS
Atopobium vaginae: 6.9 Log (cells/mL)
C. ALBICANS, DNA: NOT DETECTED
C. GLABRATA, DNA: NOT DETECTED
C. TROPICALIS, DNA: NOT DETECTED
C. parapsilosis, DNA: NOT DETECTED
C. trachomatis RNA, TMA: NOT DETECTED
Gardnerella vaginalis: >8 Log (cells/mL)
LACTOBACILLUS SPECIES: NOT DETECTED Log (cells/mL)
MEGASPHAERA SPECIES: >8 Log (cells/mL)
N. GONORRHOEAE RNA, TMA: NOT DETECTED
T. vaginalis RNA, QL TMA: NOT DETECTED

## 2015-11-16 NOTE — Telephone Encounter (Signed)
patient has appt with Dr. Lorin PicketScott Macdiarmid at [facility] on friday 12/30 at 8:15am and is aware of appt date and time

## 2015-11-21 ENCOUNTER — Telehealth: Payer: Self-pay | Admitting: *Deleted

## 2015-11-21 NOTE — Telephone Encounter (Signed)
Patient called to let us know that the cyst ruptured and drained. She was seen at Urology and she was started on antibiotics. She is not sure what her follow up plan is. She states she has called their office to let them know also- but the nurse she spoke to was not very nice or helpful. Encouraged patient to call back tomorrow to find out what her follow up is and see if she has a response from her provider. Per Dr Clearance CootsHarper- she should finish her antibiotics , continue warm soaks, and use Ibuprofen for her discomfort.

## 2015-11-22 ENCOUNTER — Other Ambulatory Visit: Payer: Self-pay | Admitting: Urology

## 2015-11-22 DIAGNOSIS — N361 Urethral diverticulum: Secondary | ICD-10-CM

## 2015-11-29 ENCOUNTER — Encounter: Payer: Self-pay | Admitting: *Deleted

## 2015-12-01 ENCOUNTER — Ambulatory Visit (HOSPITAL_COMMUNITY)
Admission: RE | Admit: 2015-12-01 | Discharge: 2015-12-01 | Disposition: A | Discharge status: Home / Self Care | Payer: Medicaid Other | Source: Ambulatory Visit | Attending: Urology | Admitting: Urology

## 2015-12-01 DIAGNOSIS — N361 Urethral diverticulum: Secondary | ICD-10-CM | POA: Diagnosis not present

## 2015-12-15 ENCOUNTER — Encounter: Payer: Self-pay | Admitting: *Deleted

## 2015-12-29 ENCOUNTER — Encounter: Payer: Self-pay | Admitting: *Deleted

## 2016-03-14 ENCOUNTER — Emergency Department (HOSPITAL_COMMUNITY)
Admission: EM | Admit: 2016-03-14 | Discharge: 2016-03-14 | Disposition: A | Discharge status: Home / Self Care | Payer: Medicaid Other | Attending: Emergency Medicine | Admitting: Emergency Medicine

## 2016-03-14 ENCOUNTER — Emergency Department (HOSPITAL_COMMUNITY): Payer: Medicaid Other

## 2016-03-14 ENCOUNTER — Encounter (HOSPITAL_COMMUNITY): Payer: Self-pay | Admitting: *Deleted

## 2016-03-14 DIAGNOSIS — Y9289 Other specified places as the place of occurrence of the external cause: Secondary | ICD-10-CM | POA: Insufficient documentation

## 2016-03-14 DIAGNOSIS — W108XXA Fall (on) (from) other stairs and steps, initial encounter: Secondary | ICD-10-CM | POA: Insufficient documentation

## 2016-03-14 DIAGNOSIS — Y9301 Activity, walking, marching and hiking: Secondary | ICD-10-CM | POA: Insufficient documentation

## 2016-03-14 DIAGNOSIS — Y998 Other external cause status: Secondary | ICD-10-CM | POA: Insufficient documentation

## 2016-03-14 DIAGNOSIS — Z793 Long term (current) use of hormonal contraceptives: Secondary | ICD-10-CM | POA: Insufficient documentation

## 2016-03-14 DIAGNOSIS — F17210 Nicotine dependence, cigarettes, uncomplicated: Secondary | ICD-10-CM | POA: Diagnosis not present

## 2016-03-14 DIAGNOSIS — Z79899 Other long term (current) drug therapy: Secondary | ICD-10-CM | POA: Diagnosis not present

## 2016-03-14 DIAGNOSIS — Z792 Long term (current) use of antibiotics: Secondary | ICD-10-CM | POA: Diagnosis not present

## 2016-03-14 DIAGNOSIS — S9031XA Contusion of right foot, initial encounter: Secondary | ICD-10-CM | POA: Diagnosis not present

## 2016-03-14 DIAGNOSIS — J45909 Unspecified asthma, uncomplicated: Secondary | ICD-10-CM | POA: Diagnosis not present

## 2016-03-14 DIAGNOSIS — Z862 Personal history of diseases of the blood and blood-forming organs and certain disorders involving the immune mechanism: Secondary | ICD-10-CM | POA: Diagnosis not present

## 2016-03-14 DIAGNOSIS — S99921A Unspecified injury of right foot, initial encounter: Secondary | ICD-10-CM | POA: Diagnosis present

## 2016-03-14 NOTE — ED Notes (Signed)
Declined W/C at D/C and was escorted to lobby by RN. 

## 2016-03-14 NOTE — Progress Notes (Signed)
Orthopedic Tech Progress Note Patient Details:  Yvonne Castillo 04/16/1996 [id number]  Ortho Devices Type of Ortho Device: Crutches Ortho Device/Splint Interventions: Application   [name] 03/14/2016, 2:34 PM

## 2016-03-14 NOTE — ED Notes (Signed)
Pt reports rt foot pain that started after she fell and landed on her foot. Pt reports hearing a "crack" pt has swelling to foot.

## 2016-03-14 NOTE — Discharge Instructions (Signed)
Do NOT bear weight on your foot. Use crutches.  Follow up with your primary care provider and orthopedics within 2 days. Name and number of orthopedic surgeon listed above.  Return to the emergency department if you experience worsening pain, numbness of your toes, your toes turn blue, increased swelling or any signs of infection to include redness, warmth, increased pain, fever or chills.   Foot Contusion A foot contusion is a deep bruise to the foot. Contusions are the result of an injury that caused bleeding under the skin. The contusion may turn blue, purple, or yellow. Minor injuries will give you a painless contusion, but more severe contusions may stay painful and swollen for a few weeks. CAUSES  A foot contusion comes from a direct blow to that area, such as a heavy object falling on the foot. SYMPTOMS   Swelling of the foot.  Discoloration of the foot.  Tenderness or soreness of the foot. DIAGNOSIS  You will have a physical exam and will be asked about your history. You may need an X-ray of your foot to look for a broken bone (fracture).  TREATMENT  An elastic wrap may be recommended to support your foot. Resting, elevating, and applying cold compresses to your foot are often the best treatments for a foot contusion. Over-the-counter medicines may also be recommended for pain control. HOME CARE INSTRUCTIONS   Put ice on the injured area.  Put ice in a plastic bag.  Place a towel between your skin and the bag.  Leave the ice on for 15-20 minutes, 03-04 times a day.  Only take over-the-counter or prescription medicines for pain, discomfort, or fever as directed by your caregiver.  If told, use an elastic wrap as directed. This can help reduce swelling. You may remove the wrap for sleeping, showering, and bathing. If your toes become numb, cold, or blue, take the wrap off and reapply it more loosely.  Elevate your foot with pillows to reduce swelling.  Try to avoid  standing or walking while the foot is painful. Do not resume use until instructed by your caregiver. Then, begin use gradually. If pain develops, decrease use. Gradually increase activities that do not cause discomfort until you have normal use of your foot.  See your caregiver as directed. It is very important to keep all follow-up appointments in order to avoid any lasting problems with your foot, including long-term (chronic) pain. SEEK IMMEDIATE MEDICAL CARE IF:   You have increased redness, swelling, or pain in your foot.  Your swelling or pain is not relieved with medicines.  You have loss of feeling in your foot or are unable to move your toes.  Your foot turns cold or blue.  You have pain when you move your toes.  Your foot becomes warm to the touch.  Your contusion does not improve in 2 days. MAKE SURE YOU:   Understand these instructions.  Will watch your condition.  Will get help right away if you are not doing well or get worse.   This information is not intended to replace advice given to you by your health care provider. Make sure you discuss any questions you have with your health care provider.   Document Released: 08/26/2006 Document Revised: 05/05/2012 Document Reviewed: 07/11/2015 Elsevier Interactive Patient Education [employer]2016 [employer] Inc[employer]

## 2016-03-14 NOTE — ED Provider Notes (Signed)
CSN: [id number]     Arrival date & time 03/14/16  1215 History  By signing my name below, I, Yvonne Castillo, attest that this documentation has been prepared under the direction and in the presence of [name], PA-C Electronically Signed: Charline BillsEssence Castillo, ED Scribe 03/14/2016 at 3:15 PM.   Chief Complaint  Patient presents with  . Foot Injury   The history is provided by the patient. No language interpreter was used.   HPI Comments: Yvonne Castillo is a 20 y.o. female who presents to the Emergency Department complaining of constant, 8/10 right foot pain s/p a fall that occurred approximately 2 hours ago. Pt states that she was walking down stairs in slippers when her slippers bent backwards and she fell on the top of her right foot. No head injury no LOC. She currently reports an aching, burning sensation that radiates into her anterior mid calf and is increased with ambulating. Pain on the middle plantar aspect of foot with ambulation. No treatments tried PTA. She denies numbness/tingling, ankle, knee or hip pain, nausea, vomiting, chest pain, SOB, dizziness, syncope. Pt does not currently have a PCP.  Past Medical History  Diagnosis Date  . Asthma   . Preeclampsia in postpartum period   . Anemia   . Pregnancy induced hypertension 2011    post partum   Past Surgical History  Procedure Laterality Date  . No past surgeries     Family History  Problem Relation Age of Onset  . Anemia Mother   . Depression Mother   . Asthma Brother   . Diabetes Maternal Grandmother   . Heart disease Paternal Grandfather    Social History  Substance Use Topics  . Smoking status: Current Some Day Smoker -- 0.25 packs/day    Types: Cigars, Cigarettes  . Smokeless tobacco: Never Used  . Alcohol Use: 0.0 oz/week    0 Standard drinks or equivalent per week     Comment: occ   OB History    Gravida Para Term Preterm AB TAB SAB Ectopic Multiple Living   4 4 3 1      3      Review of Systems   Respiratory: Negative for shortness of breath.   Cardiovascular: Negative for chest pain.  Gastrointestinal: Negative for nausea and vomiting.  Musculoskeletal: Positive for joint swelling and arthralgias.  Neurological: Negative for dizziness, syncope and numbness.   Allergies  Review of patient's allergies indicates no known allergies.  Home Medications   Prior to Admission medications   Medication Sig Start Date End Date Taking? Authorizing Provider  albuterol (PROVENTIL HFA;VENTOLIN HFA) 108 (90 BASE) MCG/ACT inhaler Inhale 2 puffs into the lungs every 6 (six) hours as needed for wheezing or shortness of breath. Patient not taking: Reported on 11/09/2015 03/21/14   Minta BalsamMichael R Odom, MD  cyclobenzaprine (FLEXERIL) 10 MG tablet Take 1 tablet (10 mg total) by mouth 2 (two) times daily as needed for muscle spasms. Patient not taking: Reported on 11/09/2015 06/03/15   Marny LowensteinJulie N Wenzel, PA-C  Levonorgestrel-Ethinyl Estradiol (SEASONIQUE) 0.15-0.03 &0.01 MG tablet Take 1 tablet by mouth daily. 11/10/15   [name], CNM  metroNIDAZOLE (FLAGYL) 500 MG tablet Take 1 tablet (500 mg total) by mouth 2 (two) times daily. 11/15/15   [name], CNM  Prenat w/o A Vit-FeFum-FePo-FA (CONCEPT OB) 130-92.4-1 MG CAPS Take 1 tablet by mouth daily. Patient not taking: Reported on 11/09/2015 06/16/15   [name], CNM   BP 117/79 mmHg  Pulse 71  Temp(Src) 98.7 F (37.1 C) (Oral)  Resp 14  SpO2 100%  LMP 02/10/2015 Physical Exam  Constitutional: She is oriented to person, place, and time. She appears well-developed and well-nourished. No distress.  HENT:  Head: Normocephalic and atraumatic.  Eyes: Conjunctivae are normal.  Neck: Normal range of motion. No tracheal deviation present.  Cardiovascular: Normal rate.   Pulmonary/Chest: Effort normal. No respiratory distress.  Musculoskeletal: Normal range of motion. She exhibits tenderness.  Large hemmatoma to dorsum of lateral right foot.  TTP of dorsum. No ankle joint swelling. No tenderness to palpation to the lateral or medial malleoli. Limited ROM due to pain. Sensation intact and 2+ DP pulses bilaterally.   Neurological: She is alert and oriented to person, place, and time.  Skin: Skin is warm and dry.  Psychiatric: She has a normal mood and affect. Her behavior is normal.  Nursing note and vitals reviewed.  ED Course  Procedures (including critical care time) DIAGNOSTIC STUDIES: Oxygen Saturation is 100% on RA, normal by my interpretation.    COORDINATION OF CARE: 1:58 PM-Discussed treatment plan which includes XR, post-op shoe and crutches with pt at bedside and pt agreed to plan.   Labs Review Labs Reviewed - No data to display  Imaging Review Dg Foot Complete Right  03/14/2016  CLINICAL DATA:  Pt reports rt foot pain that started after she fell and landed on her foot. Pt reports hearing a "crack" pt has swelling to foot. Pain all over right foot. EXAM: RIGHT FOOT COMPLETE - 3+ VIEW COMPARISON:  None. FINDINGS: There is no evidence of fracture or dislocation. There is no evidence of arthropathy or other focal bone abnormality. Soft tissues are unremarkable. IMPRESSION: Negative. Electronically Signed   By: Amie Portland M.D.   On: 03/14/2016 13:19   I have personally reviewed and evaluated these images and lab results as part of my medical decision-making.   EKG Interpretation None      MDM   Final diagnoses:  Contusion, foot, right, initial encounter    Patient with pain to top of right foot after fall. Hematoma to lateral aspect of dorsum of the right foot. Patient ambulatory immediately after injury. Low suspicion for missed [facility] on imaging. Spoke with Dr. Clayborne Dana about the patient and he suggested a cam walker boot and follow up with orthopedics in one week if no improvement. Patient states she cannot wear the boot due to pain on the top of her foot and also it is painful to place her foot flat.   I  placed the patient is a postop shoe, gave her crutches, told her not to bear weight on her foot, use RICE, NSAIDS and instructed her to follow up with orthopedics tomorrow. I gave her a work note. I discussed strict return precautions. Patient expressed understanding to the discharge instructions.   I personally performed the services described in this documentation, which was scribed in my presence. The recorded information has been reviewed and is accurate.      Jerre Simon, PA 03/14/16 1516  Marily Memos, MD 03/14/16 [id number]

## 2016-05-03 ENCOUNTER — Ambulatory Visit: Payer: Medicaid Other | Admitting: Certified Nurse Midwife

## 2016-12-26 ENCOUNTER — Encounter (HOSPITAL_COMMUNITY): Payer: Self-pay | Admitting: Emergency Medicine

## 2016-12-26 ENCOUNTER — Emergency Department (HOSPITAL_COMMUNITY)
Admission: EM | Admit: 2016-12-26 | Discharge: 2016-12-26 | Disposition: A | Discharge status: Home / Self Care | Payer: Medicaid Other | Attending: Emergency Medicine | Admitting: Emergency Medicine

## 2016-12-26 DIAGNOSIS — Z79899 Other long term (current) drug therapy: Secondary | ICD-10-CM | POA: Insufficient documentation

## 2016-12-26 DIAGNOSIS — O26891 Other specified pregnancy related conditions, first trimester: Secondary | ICD-10-CM | POA: Insufficient documentation

## 2016-12-26 DIAGNOSIS — F17290 Nicotine dependence, other tobacco product, uncomplicated: Secondary | ICD-10-CM | POA: Diagnosis not present

## 2016-12-26 DIAGNOSIS — R1010 Upper abdominal pain, unspecified: Secondary | ICD-10-CM | POA: Insufficient documentation

## 2016-12-26 DIAGNOSIS — Z3490 Encounter for supervision of normal pregnancy, unspecified, unspecified trimester: Secondary | ICD-10-CM

## 2016-12-26 DIAGNOSIS — J45909 Unspecified asthma, uncomplicated: Secondary | ICD-10-CM | POA: Insufficient documentation

## 2016-12-26 DIAGNOSIS — O99331 Smoking (tobacco) complicating pregnancy, first trimester: Secondary | ICD-10-CM | POA: Diagnosis not present

## 2016-12-26 DIAGNOSIS — Z3A01 Less than 8 weeks gestation of pregnancy: Secondary | ICD-10-CM | POA: Diagnosis not present

## 2016-12-26 LAB — COMPREHENSIVE METABOLIC PANEL
ALT: 11 U/L — AB (ref 14–54)
AST: 17 U/L (ref 15–41)
Albumin: 3.7 g/dL (ref 3.5–5.0)
Alkaline Phosphatase: 57 U/L (ref 38–126)
Anion gap: 9 (ref 5–15)
BUN: 7 mg/dL (ref 6–20)
CHLORIDE: 105 mmol/L (ref 101–111)
CO2: 23 mmol/L (ref 22–32)
CREATININE: 0.53 mg/dL (ref 0.44–1.00)
Calcium: 9.2 mg/dL (ref 8.9–10.3)
Glucose, Bld: 92 mg/dL (ref 65–99)
POTASSIUM: 3.4 mmol/L — AB (ref 3.5–5.1)
SODIUM: 137 mmol/L (ref 135–145)
Total Bilirubin: 0.5 mg/dL (ref 0.3–1.2)
Total Protein: 6.1 g/dL — ABNORMAL LOW (ref 6.5–8.1)

## 2016-12-26 LAB — CBC WITH DIFFERENTIAL/PLATELET
BASOS ABS: 0 10*3/uL (ref 0.0–0.1)
Basophils Relative: 0 %
Eosinophils Absolute: 0.3 10*3/uL (ref 0.0–0.7)
Eosinophils Relative: 3 %
HEMATOCRIT: 38.6 % (ref 36.0–46.0)
HEMOGLOBIN: 12.8 g/dL (ref 12.0–15.0)
LYMPHS ABS: 1.8 10*3/uL (ref 0.7–4.0)
LYMPHS PCT: 23 %
MCH: 29.3 pg (ref 26.0–34.0)
MCHC: 33.2 g/dL (ref 30.0–36.0)
MCV: 88.3 fL (ref 78.0–100.0)
Monocytes Absolute: 0.4 10*3/uL (ref 0.1–1.0)
Monocytes Relative: 4 %
NEUTROS ABS: 5.7 10*3/uL (ref 1.7–7.7)
Neutrophils Relative %: 70 %
Platelets: 234 10*3/uL (ref 150–400)
RBC: 4.37 MIL/uL (ref 3.87–5.11)
RDW: 14.8 % (ref 11.5–15.5)
WBC: 8.1 10*3/uL (ref 4.0–10.5)

## 2016-12-26 LAB — URINALYSIS, ROUTINE W REFLEX MICROSCOPIC
Bilirubin Urine: NEGATIVE
Glucose, UA: NEGATIVE mg/dL
HGB URINE DIPSTICK: NEGATIVE
Ketones, ur: NEGATIVE mg/dL
Leukocytes, UA: NEGATIVE
NITRITE: NEGATIVE
PROTEIN: NEGATIVE mg/dL
SPECIFIC GRAVITY, URINE: 1.018 (ref 1.005–1.030)
pH: 6 (ref 5.0–8.0)

## 2016-12-26 LAB — POC URINE PREG, ED: PREG TEST UR: POSITIVE — AB

## 2016-12-26 LAB — LIPASE, BLOOD: LIPASE: 18 U/L (ref 11–51)

## 2016-12-26 LAB — I-STAT BETA HCG BLOOD, ED (MC, WL, AP ONLY): I-stat hCG, quantitative: >2000 m[IU]/mL — ABNORMAL HIGH (ref <5)

## 2016-12-26 MED ORDER — GI COCKTAIL ~~LOC~~
30.0000 mL | Freq: Once | ORAL | Status: AC
  Administered 2016-12-26: 30 mL via ORAL
  Filled 2016-12-26: qty 30

## 2016-12-26 MED ORDER — ONDANSETRON 4 MG PO TBDP
8.0000 mg | ORAL_TABLET | Freq: Once | ORAL | Status: AC
  Administered 2016-12-26: 8 mg via ORAL
  Filled 2016-12-26: qty 2

## 2016-12-26 NOTE — ED Triage Notes (Signed)
Pt sts abd pain after eating since vomiting on Sunday

## 2016-12-26 NOTE — ED Notes (Signed)
NAD at this time. Pt is STABLE AND GOING HOME

## 2016-12-26 NOTE — Discharge Instructions (Signed)
As discussed, your evaluation today has been largely reassuring.  But, it is important that you monitor your condition carefully, and do not hesitate to return to the ED if you develop new, or concerning changes in your condition. ? ?Otherwise, please follow-up with your physician for appropriate ongoing care. ? ?

## 2016-12-26 NOTE — ED Notes (Signed)
Pt tried to ESign no pad available

## 2016-12-26 NOTE — ED Provider Notes (Signed)
[facility]-EMERGENCY DEPT Provider Note   CSN: [id number] Arrival date & time: 12/26/16  4098  By signing my name below, I, [name], attest that this documentation has been prepared under the direction and in the presence of [name], MD . Electronically Signed: [name], Scribe. 12/26/2016. 12:27 PM.   History   Chief Complaint Chief Complaint  Patient presents with  . Abdominal Pain     The history is provided by the patient. No language interpreter was used.     HPI Comments:  Yvonne Castillo is a 21 y.o. female who presents to the Emergency Department complaining of sharp, upper abdominal pain x 5 days. She states she has the pain and a feeling of hunger ~ 20 minutes after eating. She denies diarrhea. Pt had 1 episode of vomiting 4-5 days ago , none since. No h/o abdominal surgeries. No alleviating factors noted; no treatments tried PTA.  Pt is an occasional black and mild smoker and occasional alcohol drinker; last drink was a glass of wine 6 days ago.   Past Medical History:  Diagnosis Date  . Anemia   . Asthma   . Preeclampsia in postpartum period   . Pregnancy induced hypertension 2011   post partum    Patient Active Problem List   Diagnosis Date Noted  . Active labor at term 05/11/2014  . Hx of preeclampsia, prior pregnancy, currently pregnant 03/25/2014  . Supervision of normal pregnancy 03/24/2014  . Anemia during pregnancy 03/24/2014  . Asthma 03/21/2014  . Nephrolithiasis 02/18/2014    Past Surgical History:  Procedure Laterality Date  . NO PAST SURGERIES      OB History    Gravida Para Term Preterm AB Living   4 4 3 1   3    SAB TAB Ectopic Multiple Live Births           4       Home Medications    Prior to Admission medications   Medication Sig Start Date End Date Taking? Authorizing Provider  albuterol (PROVENTIL HFA;VENTOLIN HFA) 108 (90 BASE) MCG/ACT inhaler Inhale 2 puffs into the lungs every 6 (six) hours as needed for wheezing or  shortness of breath. Patient not taking: Reported on 11/09/2015 03/21/14   Minta Balsam, MD  cyclobenzaprine (FLEXERIL) 10 MG tablet Take 1 tablet (10 mg total) by mouth 2 (two) times daily as needed for muscle spasms. Patient not taking: Reported on 11/09/2015 06/03/15   Marny Lowenstein, PA-C  Levonorgestrel-Ethinyl Estradiol (SEASONIQUE) 0.15-0.03 &0.01 MG tablet Take 1 tablet by mouth daily. 11/10/15   [name], CNM  metroNIDAZOLE (FLAGYL) 500 MG tablet Take 1 tablet (500 mg total) by mouth 2 (two) times daily. 11/15/15   [name], CNM  Prenat w/o A Vit-FeFum-FePo-FA (CONCEPT OB) 130-92.4-1 MG CAPS Take 1 tablet by mouth daily. Patient not taking: Reported on 11/09/2015 06/16/15   Dorathy Kinsman, CNM    Family History Family History  Problem Relation Age of Onset  . Anemia Mother   . Depression Mother   . Asthma Brother   . Diabetes Maternal Grandmother   . Heart disease Paternal Grandfather     Social History Social History  Substance Use Topics  . Smoking status: Current Some Day Smoker    Packs/day: 0.25    Types: Cigars, Cigarettes  . Smokeless tobacco: Never Used  . Alcohol use 0.0 oz/week     Comment: occ     Allergies   Patient has no known allergies.  Review of Systems Review of Systems  Constitutional:       Per HPI, otherwise negative  HENT:       Per HPI, otherwise negative  Respiratory:       Per HPI, otherwise negative  Cardiovascular:       Per HPI, otherwise negative  Gastrointestinal: Negative for vomiting.  Endocrine:       Negative aside from HPI  Genitourinary:       Neg aside from HPI   Musculoskeletal:       Per HPI, otherwise negative  Skin: Negative.   Neurological: Negative for syncope.     Physical Exam Updated Vital Signs BP 109/66 (BP Location: Left Arm)   Pulse 66   Temp 98.8 F (37.1 C) (Oral)   Resp 17   SpO2 99%   Physical Exam  Constitutional: She is oriented to person, place, and time. She appears  well-developed and well-nourished. No distress.  HENT:  Head: Normocephalic and atraumatic.  Eyes: Conjunctivae and EOM are normal.  Cardiovascular: Normal rate and regular rhythm.   Pulmonary/Chest: Effort normal and breath sounds normal. No stridor. No respiratory distress.  Abdominal: Soft. She exhibits no distension. There is tenderness in the epigastric area.  Musculoskeletal: She exhibits no edema.  Neurological: She is alert and oriented to person, place, and time. No cranial nerve deficit.  Skin: Skin is warm and dry.  Psychiatric: She has a normal mood and affect.  Nursing note and vitals reviewed.    ED Treatments / Results  DIAGNOSTIC STUDIES:  Oxygen Saturation is 99% on RA, normal by my interpretation.    COORDINATION OF CARE:  12:20 PM Discussed treatment plan with pt at bedside and pt agreed to plan.     Labs (all labs ordered are listed, but only abnormal results are displayed) Labs Reviewed  POC URINE PREG, ED - Abnormal; Notable for the following:       Result Value   Preg Test, Ur POSITIVE (*)    All other components within normal limits  URINALYSIS, ROUTINE W REFLEX MICROSCOPIC  COMPREHENSIVE METABOLIC PANEL  LIPASE, BLOOD  CBC WITH DIFFERENTIAL/PLATELET  I-STAT BETA HCG BLOOD, ED ([facility], [facility], [facility] ONLY)    Procedures Procedures (including critical care time)  Medications Ordered in ED Medications  ondansetron (ZOFRAN-ODT) disintegrating tablet 8 mg (8 mg Oral Given 12/26/16 1255)  gi cocktail (Maalox,Lidocaine,Donnatal) (30 mLs Oral Given 12/26/16 1255)     Initial Impression / Assessment and Plan / ED Course  I have reviewed the triage vital signs and the nursing notes.  Pertinent labs & imaging results that were available during my care of the patient were reviewed by me and considered in my medical decision making (see chart for details).  2:48 PM Patient asleep, awakens easily. We discussed all findings including largely reassuring labs is from  evidence for pregnancy. Patient has states that her last menstrual period was about 2 months ago. She has no other complaints, including no lower abdominal pain, she has a gynecologist, with eventual follow-up. With no evidence for lower abdominal pain, there is low suspicion for ectopic pregnancy, no distress, no unstable vital signs, patient appropriate for close outpatient follow-up.  Final Clinical Impressions(s) / ED Diagnoses   Final diagnoses:  Pain of upper abdomen  Pregnancy, unspecified gestational age    I personally performed the services described in this documentation, which was scribed in my presence. The recorded information has been reviewed and is accurate.    Gerhard Munchobert Aryaa Bunting,  MD 12/26/16 1449

## 2017-01-25 ENCOUNTER — Emergency Department (HOSPITAL_COMMUNITY): Payer: Medicaid Other

## 2017-01-25 ENCOUNTER — Emergency Department (HOSPITAL_COMMUNITY)
Admission: EM | Admit: 2017-01-25 | Discharge: 2017-01-25 | Disposition: A | Discharge status: Home / Self Care | Payer: Medicaid Other | Attending: Emergency Medicine | Admitting: Emergency Medicine

## 2017-01-25 ENCOUNTER — Encounter (HOSPITAL_COMMUNITY): Payer: Self-pay

## 2017-01-25 DIAGNOSIS — J45909 Unspecified asthma, uncomplicated: Secondary | ICD-10-CM | POA: Diagnosis not present

## 2017-01-25 DIAGNOSIS — F17210 Nicotine dependence, cigarettes, uncomplicated: Secondary | ICD-10-CM | POA: Insufficient documentation

## 2017-01-25 DIAGNOSIS — N200 Calculus of kidney: Secondary | ICD-10-CM

## 2017-01-25 DIAGNOSIS — R109 Unspecified abdominal pain: Secondary | ICD-10-CM | POA: Diagnosis present

## 2017-01-25 DIAGNOSIS — N132 Hydronephrosis with renal and ureteral calculous obstruction: Secondary | ICD-10-CM | POA: Diagnosis not present

## 2017-01-25 LAB — COMPREHENSIVE METABOLIC PANEL
ALBUMIN: 3.5 g/dL (ref 3.5–5.0)
ALK PHOS: 56 U/L (ref 38–126)
ALT: 10 U/L — ABNORMAL LOW (ref 14–54)
ANION GAP: 7 (ref 5–15)
AST: 17 U/L (ref 15–41)
BUN: 10 mg/dL (ref 6–20)
CALCIUM: 8.8 mg/dL — AB (ref 8.9–10.3)
CO2: 24 mmol/L (ref 22–32)
Chloride: 108 mmol/L (ref 101–111)
Creatinine, Ser: 0.79 mg/dL (ref 0.44–1.00)
GFR calc Af Amer: >60 mL/min (ref >60)
GFR calc non Af Amer: >60 mL/min (ref >60)
GLUCOSE: 90 mg/dL (ref 65–99)
POTASSIUM: 3.5 mmol/L (ref 3.5–5.1)
SODIUM: 139 mmol/L (ref 135–145)
Total Bilirubin: 0.2 mg/dL — ABNORMAL LOW (ref 0.3–1.2)
Total Protein: 6.3 g/dL — ABNORMAL LOW (ref 6.5–8.1)

## 2017-01-25 LAB — I-STAT BETA HCG BLOOD, ED (MC, WL, AP ONLY): I-stat hCG, quantitative: 1389.8 m[IU]/mL — ABNORMAL HIGH (ref <5)

## 2017-01-25 LAB — URINALYSIS, ROUTINE W REFLEX MICROSCOPIC
BACTERIA UA: NONE SEEN
BILIRUBIN URINE: NEGATIVE
GLUCOSE, UA: NEGATIVE mg/dL
KETONES UR: NEGATIVE mg/dL
Leukocytes, UA: NEGATIVE
Nitrite: NEGATIVE
PH: 6 (ref 5.0–8.0)
Protein, ur: NEGATIVE mg/dL
SPECIFIC GRAVITY, URINE: 1.018 (ref 1.005–1.030)

## 2017-01-25 LAB — CBC
HEMATOCRIT: 36.4 % (ref 36.0–46.0)
HEMOGLOBIN: 12.2 g/dL (ref 12.0–15.0)
MCH: 30.5 pg (ref 26.0–34.0)
MCHC: 33.5 g/dL (ref 30.0–36.0)
MCV: 91 fL (ref 78.0–100.0)
Platelets: 242 10*3/uL (ref 150–400)
RBC: 4 MIL/uL (ref 3.87–5.11)
RDW: 14.5 % (ref 11.5–15.5)
WBC: 9.6 10*3/uL (ref 4.0–10.5)

## 2017-01-25 LAB — LIPASE, BLOOD: Lipase: <10 U/L — ABNORMAL LOW (ref 11–51)

## 2017-01-25 MED ORDER — MORPHINE SULFATE (PF) 4 MG/ML IV SOLN
4.0000 mg | Freq: Once | INTRAVENOUS | Status: AC
Start: 2017-01-25 — End: 2017-01-25
  Administered 2017-01-25: 4 mg via INTRAVENOUS
  Filled 2017-01-25: qty 1

## 2017-01-25 MED ORDER — ONDANSETRON HCL 4 MG PO TABS: 4.0000 mg | ORAL_TABLET | Freq: Four times a day (QID) | ORAL | 0 refills | Status: DC

## 2017-01-25 MED ORDER — OXYCODONE-ACETAMINOPHEN 5-325 MG PO TABS: 2.0000 | ORAL_TABLET | Freq: Four times a day (QID) | ORAL | 0 refills | Status: DC | PRN

## 2017-01-25 MED ORDER — ONDANSETRON HCL 4 MG/2ML IJ SOLN
4.0000 mg | Freq: Once | INTRAMUSCULAR | Status: AC
  Administered 2017-01-25: 4 mg via INTRAVENOUS
  Filled 2017-01-25: qty 2

## 2017-01-25 NOTE — ED Notes (Signed)
Ct scan not preformed.

## 2017-01-25 NOTE — ED Triage Notes (Signed)
Patient complains of lower back and lower abdominal pain x 2 days. States that she has taken otc pain meds with no relief. denies dysuria

## 2017-01-25 NOTE — ED Provider Notes (Signed)
[facility]-EM[facility]ENCY DEPT Provider Note   CSN: [id number] Arrival date & time: 01/25/17  1336     History   Chief Complaint Chief Complaint  Patient presents with  . Abdominal Pain  . Back Pain    HPI Yvonne Castillo is a 21 y.o. female.  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  She reports that the pain started 2 days ago.  She denies any fevers, chills, nausea, or vomiting.  She denies any dysuria.  She states that she medically terminated a pregnancy 1.5 weeks ago, and reports that she had vaginal bleeding and discharge, but this stopped yesterday.  She has not taken anything for the pain.  There are no modifying factors.  There are no other associated symptoms.   The history is provided by the patient. No language interpreter was used.    Past Medical History:  Diagnosis Date  . Anemia   . Asthma     Patient Active Problem List   Diagnosis Date Noted  . Active labor at term 05/11/2014  . Hx of preeclampsia, prior pregnancy, currently pregnant 03/25/2014  . Supervision of normal pregnancy 03/24/2014  . Anemia during pregnancy 03/24/2014  . Asthma 03/21/2014  . Nephrolithiasis 02/18/2014    Past Surgical History:  Procedure Laterality Date  . NO PAST SURGERIES      OB History    Gravida Para Term Preterm AB Living   4 4 3 1   3    SAB TAB Ectopic Multiple Live Births           4       Home Medications    Prior to Admission medications   Medication Sig Start Date End Date Taking? Authorizing Provider  albuterol (PROVENTIL HFA;VENTOLIN HFA) 108 (90 BASE) MCG/ACT inhaler Inhale 2 puffs into the lungs every 6 (six) hours as needed for wheezing or shortness of breath. Patient not taking: Reported on 11/09/2015 03/21/14   Minta BalsamMichael R Odom, MD  cyclobenzaprine (FLEXERIL) 10 MG tablet Take 1 tablet (10 mg total) by mouth 2 (two) times daily as needed for muscle spasms. Patient not taking: Reported on 11/09/2015  06/03/15   Marny LowensteinJulie N Wenzel, PA-C  Levonorgestrel-Ethinyl Estradiol (SEASONIQUE) 0.15-0.03 &0.01 MG tablet Take 1 tablet by mouth daily. 11/10/15   [name], CNM  metroNIDAZOLE (FLAGYL) 500 MG tablet Take 1 tablet (500 mg total) by mouth 2 (two) times daily. 11/15/15   [name], CNM  Prenat w/o A Vit-FeFum-FePo-FA (CONCEPT OB) 130-92.4-1 MG CAPS Take 1 tablet by mouth daily. Patient not taking: Reported on 11/09/2015 06/16/15   [name], CNM    Family History Family History  Problem Relation Age of Onset  . Anemia Mother   . Depression Mother   . Asthma Brother   . Diabetes Maternal Grandmother   . Heart disease Paternal Grandfather     Social History Social History  Substance Use Topics  . Smoking status: Current Some Day Smoker    Packs/day: 0.25    Types: Cigars, Cigarettes  . Smokeless tobacco: Never Used  . Alcohol use 0.0 oz/week     Comment: occ     Allergies   Patient has no known allergies.   Review of Systems Review of Systems  Genitourinary: Positive for flank pain. Negative for dysuria, vaginal bleeding and vaginal discharge.  All other systems reviewed and are negative.    Physical Exam Updated Vital Signs BP 127/74 (BP  Location: Right Arm)   Pulse 62   Temp 98.5 F (36.9 C)   Resp 18   Ht 5\' 3"  (1.6 m)   Wt 64.4 kg   SpO2 97%   BMI 25.15 kg/m   Physical Exam  Constitutional: She is oriented to person, place, and time. She appears well-developed and well-nourished.  HENT:  Head: Normocephalic and atraumatic.  Eyes: Conjunctivae and EOM are normal. Pupils are equal, round, and reactive to light.  Neck: Normal range of motion. Neck supple.  Cardiovascular: Normal rate and regular rhythm.  Exam reveals no gallop and no friction rub.   No murmur heard. Pulmonary/Chest: Effort normal and breath sounds normal. No respiratory distress. She has no wheezes. She has no rales. She exhibits no tenderness.  Abdominal: Soft. Bowel  sounds are normal. She exhibits no distension and no mass. There is no tenderness. There is no rebound and no guarding.  Right CVA tenderness  Musculoskeletal: Normal range of motion. She exhibits no edema or tenderness.  Neurological: She is alert and oriented to person, place, and time.  Skin: Skin is warm and dry.  Psychiatric: She has a normal mood and affect. Her behavior is normal. Judgment and thought content normal.  Nursing note and vitals reviewed.    ED Treatments / Results  Labs (all labs ordered are listed, but only abnormal results are displayed) Labs Reviewed  LIPASE, BLOOD - Abnormal; Notable for the following:       Result Value   Lipase <10 (*)    All other components within normal limits  COMPREHENSIVE METABOLIC PANEL - Abnormal; Notable for the following:    Calcium 8.8 (*)    Total Protein 6.3 (*)    ALT 10 (*)    Total Bilirubin 0.2 (*)    All other components within normal limits  URINALYSIS, ROUTINE W REFLEX MICROSCOPIC - Abnormal; Notable for the following:    Hgb urine dipstick MODERATE (*)    Squamous Epithelial / LPF 0-5 (*)    All other components within normal limits  I-STAT BETA HCG BLOOD, ED ([facility], [facility], [facility] ONLY) - Abnormal; Notable for the following:    I-stat hCG, quantitative 1,389.8 (*)    All other components within normal limits  CBC    EKG  EKG Interpretation None       Radiology Ct Renal Stone Study  Result Date: 01/25/2017 CLINICAL DATA:  Right flank pain. EXAM: CT ABDOMEN AND PELVIS WITHOUT CONTRAST TECHNIQUE: Multidetector CT imaging of the abdomen and pelvis was performed following the standard protocol without IV contrast. COMPARISON:  09/08/2010 FINDINGS: Lower chest: No acute abnormality. Hepatobiliary: No focal liver abnormality is seen. No gallstones, gallbladder wall thickening, or biliary dilatation. Pancreas: Unremarkable. No pancreatic ductal dilatation or surrounding inflammatory changes. Spleen: Normal in size without  focal abnormality. Adrenals/Urinary Tract: Adrenal glands are unremarkable. Punctate bilateral nonobstructing renal calculi. Mild right hydronephrosis. No ureteral calculus identified. Right lower pelvic phlebolith again noted unchanged from the prior exam of 09/08/2010. Bladder is unremarkable. Stomach/Bowel: Stomach is within normal limits. Appendix appears normal. No evidence of bowel wall thickening, distention, or inflammatory changes. Vascular/Lymphatic: No significant vascular findings are present. No enlarged abdominal or pelvic lymph nodes. Reproductive: Uterus and bilateral adnexa are unremarkable. Other: No abdominal wall hernia or abnormality. No abdominopelvic ascites. Musculoskeletal: No acute or significant osseous findings. IMPRESSION: 1. Punctate bilateral nonobstructing renal calculi. Mild right hydronephrosis. No ureteral calculus identified. Electronically Signed   By: Elige Ko   On: 01/25/2017  16:43    Procedures Procedures (including critical care time)  Medications Ordered in ED Medications  morphine 4 MG/ML injection 4 mg (4 mg Intravenous Given 01/25/17 1550)  ondansetron (ZOFRAN) injection 4 mg (4 mg Intravenous Given 01/25/17 1550)     Initial Impression / Assessment and Plan / ED Course  I have reviewed the triage vital signs and the nursing notes.  Pertinent labs & imaging results that were available during my care of the patient were reviewed by me and considered in my medical decision making (see chart for details).     Patient with a chief complaint of flank pain.  Hx of KS.  Recent medical abortion 1.5 weeks ago.  Denies vaginal bleeding now.  Will check for renal stone.  Patient has bilateral punctate nonobstructing renal stones. She does have some mild hydronephrosis. Suspect this is the cause of the patient's pain. She is afebrile. There is no evidence of UTI on urinalysis. I will send urine for culture. Her vital signs are stable. Recommend primary  care/urology all up as needed.  Final Clinical Impressions(s) / ED Diagnoses   Final diagnoses:  Kidney stone    New Prescriptions New Prescriptions   ONDANSETRON (ZOFRAN) 4 MG TABLET    Take 1 tablet (4 mg total) by mouth every 6 (six) hours.   OXYCODONE-ACETAMINOPHEN (PERCOCET/ROXICET) 5-325 MG TABLET    Take 2 tablets by mouth every 6 (six) hours as needed for severe pain.     Roxy Horseman, PA-C 01/25/17 1739    Laurence Spates, MD 01/26/17 [phone]

## 2017-01-26 LAB — URINE CULTURE: Culture: NO GROWTH

## 2017-03-20 ENCOUNTER — Encounter (HOSPITAL_COMMUNITY): Payer: Self-pay

## 2017-03-20 ENCOUNTER — Emergency Department (HOSPITAL_COMMUNITY)
Admission: EM | Admit: 2017-03-20 | Discharge: 2017-03-20 | Disposition: A | Discharge status: Home / Self Care | Payer: Medicaid Other | Attending: Emergency Medicine | Admitting: Emergency Medicine

## 2017-03-20 DIAGNOSIS — M25512 Pain in left shoulder: Secondary | ICD-10-CM | POA: Diagnosis present

## 2017-03-20 DIAGNOSIS — Z79899 Other long term (current) drug therapy: Secondary | ICD-10-CM | POA: Diagnosis not present

## 2017-03-20 DIAGNOSIS — F17210 Nicotine dependence, cigarettes, uncomplicated: Secondary | ICD-10-CM | POA: Diagnosis not present

## 2017-03-20 DIAGNOSIS — J45909 Unspecified asthma, uncomplicated: Secondary | ICD-10-CM | POA: Insufficient documentation

## 2017-03-20 MED ORDER — HYDROCODONE-ACETAMINOPHEN 5-325 MG PO TABS: 1.0000 | ORAL_TABLET | Freq: Four times a day (QID) | ORAL | 0 refills | Status: DC | PRN

## 2017-03-20 NOTE — ED Triage Notes (Signed)
Per Pt, Pt reports pain starting in her forearm yesterday. This morning the patient continues into her left shoulder and neck. Pt denies injury.

## 2017-03-20 NOTE — ED Provider Notes (Signed)
[facility] Provider Note   CSN: [id number] Arrival date & time: 03/20/17  1621  By signing my name below, I, Marnette Burgessyan Andrew Long, attest that this documentation has been prepared under the direction and in the presence of Vanetta MuldersScott Kshawn Canal, MD. Electronically Signed: Marnette Burgessyan Andrew Long, Scribe. 03/20/2017. 5:02 PM.  History   Chief Complaint Chief Complaint  Patient presents with  . Shoulder Pain   The history is provided by the patient and medical records. No language interpreter was used.  Shoulder Pain   This is a new problem. The current episode started yesterday. The problem occurs constantly. The problem has been gradually worsening. The pain is present in the left shoulder, left arm and left fingers. The quality of the pain is described as aching. The pain is at a severity of 8/10. Pertinent negatives include no numbness. The symptoms are aggravated by activity. She has tried OTC pain medications for the symptoms. The treatment provided mild relief. There has been no history of extremity trauma.    HPI Comments:  Yvonne Castillo is a 21 y.o. female with a PMHx of Asthma and Anemia, who presents to the Emergency Department complaining of constant, radiating, aching, 8/10 left arm pain onset yesterday afternoon. She states the left upper arm began hurting yesterday. When she awoke this morning, the pain was present in the arm and in her left shoulder, radiating down into her lower arm and fingers. She states her arm and especially her left hand "feels hot." No h/o prior injury in this shoulder or recent trauma. Pt has associated symptoms of congestion, CP, neck pain, and abdominal cramps. She tried Tylenol and Ibuprofen at home with minimal relief of her symptoms while exertion and movement exacerbate her symptoms. She is right hand dominant. Pt denies fever, rhinorrhea, sore throat, visual disturbance, cough, SOB, leg swelling, abdominal pain, diarrhea, nausea, vomiting, dysuria, hematuria, back  pain, rash, HA, numbness, bruising/bleeding easily. Pt is not currently on anticoagulants.     Past Medical History:  Diagnosis Date  . Anemia   . Asthma     Patient Active Problem List   Diagnosis Date Noted  . Active labor at term 05/11/2014  . Hx of preeclampsia, prior pregnancy, currently pregnant 03/25/2014  . Supervision of normal pregnancy 03/24/2014  . Anemia during pregnancy 03/24/2014  . Asthma 03/21/2014  . Nephrolithiasis 02/18/2014   Past Surgical History:  Procedure Laterality Date  . NO PAST SURGERIES     OB History    Gravida Para Term Preterm AB Living   4 4 3 1   3    SAB TAB Ectopic Multiple Live Births           4     Home Medications    Prior to Admission medications   Medication Sig Start Date End Date Taking? Authorizing Provider  acetaminophen (TYLENOL) 500 MG tablet Take 500 mg by mouth every 6 (six) hours as needed for mild pain.    Historical Provider, MD  albuterol (PROVENTIL HFA;VENTOLIN HFA) 108 (90 BASE) MCG/ACT inhaler Inhale 2 puffs into the lungs every 6 (six) hours as needed for wheezing or shortness of breath. Patient not taking: Reported on 11/09/2015 03/21/14   Minta BalsamMichael R Odom, MD  cyclobenzaprine (FLEXERIL) 10 MG tablet Take 1 tablet (10 mg total) by mouth 2 (two) times daily as needed for muscle spasms. Patient not taking: Reported on 11/09/2015 06/03/15   Marny LowensteinJulie N Wenzel, PA-C  HYDROcodone-acetaminophen (NORCO/VICODIN) 5-325 MG tablet Take 1-2 tablets by mouth every  6 (six) hours as needed for moderate pain. 03/20/17   Vanetta Mulders, MD  ibuprofen (ADVIL,MOTRIN) 800 MG tablet Take 800 mg by mouth every 8 (eight) hours as needed for moderate pain.    Historical Provider, MD  Levonorgestrel-Ethinyl Estradiol (SEASONIQUE) 0.15-0.03 &0.01 MG tablet Take 1 tablet by mouth daily. Patient not taking: Reported on 01/25/2017 11/10/15   [name], CNM  metroNIDAZOLE (FLAGYL) 500 MG tablet Take 1 tablet (500 mg total) by mouth 2 (two) times  daily. Patient not taking: Reported on 01/25/2017 11/15/15   [name], CNM  ondansetron (ZOFRAN) 4 MG tablet Take 1 tablet (4 mg total) by mouth every 6 (six) hours. 01/25/17   Roxy Horseman, PA-C  oxyCODONE-acetaminophen (PERCOCET/ROXICET) 5-325 MG tablet Take 2 tablets by mouth every 6 (six) hours as needed for severe pain. 01/25/17   Roxy Horseman, PA-C  Prenat w/o A Vit-FeFum-FePo-FA (CONCEPT OB) 130-92.4-1 MG CAPS Take 1 tablet by mouth daily. Patient not taking: Reported on 11/09/2015 06/16/15   Dorathy Kinsman, CNM    Family History Family History  Problem Relation Age of Onset  . Anemia Mother   . Depression Mother   . Asthma Brother   . Diabetes Maternal Grandmother   . Heart disease Paternal Grandfather    Social History Social History  Substance Use Topics  . Smoking status: Current Some Day Smoker    Packs/day: 0.25    Types: Cigars, Cigarettes  . Smokeless tobacco: Never Used  . Alcohol use 0.0 oz/week     Comment: occ   Allergies   Patient has no known allergies.   Review of Systems Review of Systems  Constitutional: Negative for fever.  HENT: Positive for congestion. Negative for rhinorrhea and sore throat.   Eyes: Negative for visual disturbance.  Respiratory: Negative for cough and shortness of breath.   Cardiovascular: Positive for chest pain. Negative for leg swelling.  Gastrointestinal: Negative for abdominal pain, diarrhea, nausea and vomiting.       Positive abdominal cramps.   Genitourinary: Negative for dysuria and hematuria.  Musculoskeletal: Positive for arthralgias, myalgias and neck pain. Negative for back pain.  Skin: Negative for rash.  Neurological: Negative for numbness and headaches.  Hematological: Does not bruise/bleed easily.     Physical Exam Updated Vital Signs BP (!) 127/91 (BP Location: Right Arm)   Pulse 94   Temp 98.4 F (36.9 C) (Oral)   Resp 18   Ht 5\' 3"  (1.6 m)   Wt 63.5 kg   LMP 03/18/2017   SpO2 98%    BMI 24.80 kg/m   Physical Exam  Constitutional: She is oriented to person, place, and time. She appears well-developed and well-nourished.  HENT:  Head: Normocephalic.  Mucus membranes moist.   Eyes: Conjunctivae are normal. Pupils are equal, round, and reactive to light.  Pupils normal, sclera clear, eyes tracking normally.   Cardiovascular: Normal rate, regular rhythm and normal heart sounds.  Exam reveals no gallop and no friction rub.   No murmur heard. No BLE edema. Capillary refill to nail beds <1 second. Left arm radial pulse 2+, very strong.   Pulmonary/Chest: Effort normal and breath sounds normal. No respiratory distress. She has no wheezes. She has no rales.  Lungs clear bilaterally.   Abdominal: Bowel sounds are normal. She exhibits no distension. There is no tenderness.  Musculoskeletal: Normal range of motion.  Trapezius muscle of the left is very tight compared to the right. Triceps area is soft. Pain with movement of  the neck to the left and right, greater to the right side. No midline tenderness.   Neurological: She is alert and oriented to person, place, and time.  Skin: Skin is warm and dry.  Psychiatric: She has a normal mood and affect.  Nursing note and vitals reviewed.  ED Treatments / Results  DIAGNOSTIC STUDIES:  Oxygen Saturation is 98% on RA, normal by my interpretation.    COORDINATION OF CARE:  5:00 PM Discussed treatment plan with pt at bedside including pain medication, sling with anti-inflammatories and pt agreed to plan.  Labs (all labs ordered are listed, but only abnormal results are displayed) Labs Reviewed - No data to display  EKG  EKG Interpretation None       Radiology No results found.  Procedures Procedures (including critical care time)  Medications Ordered in ED Medications - No data to display   Initial Impression / Assessment and Plan / ED Course  I have reviewed the triage vital signs and the nursing  notes.  Pertinent labs & imaging results that were available during my care of the patient were reviewed by me and considered in my medical decision making (see chart for details).    Patient with a complaint of left-sided neck pain left shoulder pain radiating down into the hand with a burning sensation. May very well be cervical radiculopathy. No direct injury to the shoulder overuse syndrome. Will treat with anti-inflammatories pain medicine and a sling and have her follow-up with orthopedics if not improved. Patient's of radial pulse on the left side is intact good cap refill. Sensation intact. Good range of motion of the fingers.   Final Clinical Impressions(s) / ED Diagnoses   Final diagnoses:  Acute pain of left shoulder    New Prescriptions New Prescriptions   HYDROCODONE-ACETAMINOPHEN (NORCO/VICODIN) 5-325 MG TABLET    Take 1-2 tablets by mouth every 6 (six) hours as needed for moderate pain.    I personally performed the services described in this documentation, which was scribed in my presence. The recorded information has been reviewed and is accurate.       Vanetta Mulders, MD 03/20/17 1714

## 2017-03-20 NOTE — Discharge Instructions (Signed)
Use the left arm sling as needed for comfort. Work note provided. Make appointment with orthopedics if not improved over the next few days. Continue to take 800 mg of Motrin every 8 hours for the next 7 days. Supplement with hydrocodone as needed for additional pain relief. Return for any new or worse symptoms.

## 2017-03-21 ENCOUNTER — Encounter (HOSPITAL_COMMUNITY): Payer: Self-pay | Admitting: Family Medicine

## 2017-03-21 ENCOUNTER — Emergency Department (HOSPITAL_COMMUNITY)
Admission: EM | Admit: 2017-03-21 | Discharge: 2017-03-21 | Disposition: A | Discharge status: Home / Self Care | Payer: Medicaid Other | Attending: Dermatology | Admitting: Dermatology

## 2017-03-21 ENCOUNTER — Ambulatory Visit (HOSPITAL_COMMUNITY)
Admission: EM | Admit: 2017-03-21 | Discharge: 2017-03-21 | Disposition: A | Discharge status: Home / Self Care | Payer: Medicaid Other | Attending: Family Medicine | Admitting: Family Medicine

## 2017-03-21 ENCOUNTER — Encounter (HOSPITAL_COMMUNITY): Payer: Self-pay

## 2017-03-21 DIAGNOSIS — F17210 Nicotine dependence, cigarettes, uncomplicated: Secondary | ICD-10-CM | POA: Diagnosis not present

## 2017-03-21 DIAGNOSIS — S43402A Unspecified sprain of left shoulder joint, initial encounter: Secondary | ICD-10-CM

## 2017-03-21 DIAGNOSIS — J45909 Unspecified asthma, uncomplicated: Secondary | ICD-10-CM | POA: Diagnosis not present

## 2017-03-21 DIAGNOSIS — T887XXA Unspecified adverse effect of drug or medicament, initial encounter: Secondary | ICD-10-CM

## 2017-03-21 DIAGNOSIS — R110 Nausea: Secondary | ICD-10-CM | POA: Diagnosis not present

## 2017-03-21 DIAGNOSIS — S46912A Strain of unspecified muscle, fascia and tendon at shoulder and upper arm level, left arm, initial encounter: Secondary | ICD-10-CM

## 2017-03-21 DIAGNOSIS — Z5321 Procedure and treatment not carried out due to patient leaving prior to being seen by health care provider: Secondary | ICD-10-CM | POA: Diagnosis not present

## 2017-03-21 DIAGNOSIS — M25512 Pain in left shoulder: Secondary | ICD-10-CM | POA: Diagnosis not present

## 2017-03-21 DIAGNOSIS — N939 Abnormal uterine and vaginal bleeding, unspecified: Secondary | ICD-10-CM | POA: Insufficient documentation

## 2017-03-21 MED ORDER — CYCLOBENZAPRINE HCL 5 MG PO TABS: 5.0000 mg | ORAL_TABLET | Freq: Three times a day (TID) | ORAL | 0 refills | Status: DC | PRN

## 2017-03-21 NOTE — ED Notes (Signed)
The patient initially presented with a complaint of excessive vaginal bleeding secondary to a terminated pregnancy on 01/16/2017. After conferring with the provider they advised that [facility]. The patient stated that [facility] and did not want to wait [facility]. [facility].

## 2017-03-21 NOTE — ED Provider Notes (Signed)
CSN: [id number]     Arrival date & time 03/21/17  1658 History   None    Chief Complaint  Patient presents with  . Nausea   (Consider location/radiation/quality/duration/timing/severity/associated sxs/prior Treatment) Patient c/o nausea due to pain meds and wants to have medication changes.  Patient was diagnosed with left shoulder pain/ strain.  She cannot tolerate the hydrocodone.   The history is provided by the patient.  Shoulder Pain  Location:  Shoulder Shoulder location:  L shoulder Injury: no   Pain details:    Quality:  Aching   Radiates to:  L shoulder   Severity:  Moderate   Onset quality:  Sudden   Duration:  2 days   Timing:  Constant   Progression:  Worsening Dislocation: no   Foreign body present:  No foreign bodies Tetanus status:  Unknown Prior injury to area:  No Relieved by:  Nothing Worsened by:  Nothing Ineffective treatments:  None tried   Past Medical History:  Diagnosis Date  . Anemia   . Asthma    Past Surgical History:  Procedure Laterality Date  . NO PAST SURGERIES     Family History  Problem Relation Age of Onset  . Anemia Mother   . Depression Mother   . Asthma Brother   . Diabetes Maternal Grandmother   . Heart disease Paternal Grandfather    Social History  Substance Use Topics  . Smoking status: Current Some Day Smoker    Packs/day: 0.25    Types: Cigars, Cigarettes  . Smokeless tobacco: Never Used  . Alcohol use 0.0 oz/week     Comment: occ   OB History    Gravida Para Term Preterm AB Living   4 4 3 1   3    SAB TAB Ectopic Multiple Live Births           4     Review of Systems  Constitutional: Negative.   HENT: Negative.   Eyes: Negative.   Respiratory: Negative.   Cardiovascular: Negative.   Gastrointestinal: Negative.   Endocrine: Negative.   Genitourinary: Negative.   Musculoskeletal: Positive for arthralgias.  Allergic/Immunologic: Negative.   Neurological: Negative.   Hematological: Negative.      Allergies  Patient has no known allergies.  Home Medications   Prior to Admission medications   Medication Sig Start Date End Date Taking? Authorizing Provider  acetaminophen (TYLENOL) 500 MG tablet Take 500 mg by mouth every 6 (six) hours as needed for mild pain.    Historical Provider, MD  cyclobenzaprine (FLEXERIL) 5 MG tablet Take 1 tablet (5 mg total) by mouth 3 (three) times daily as needed for muscle spasms. 03/21/17   Deatra CanterWilliam J Gagandeep Kossman, FNP  HYDROcodone-acetaminophen (NORCO/VICODIN) 5-325 MG tablet Take 1-2 tablets by mouth every 6 (six) hours as needed for moderate pain. 03/20/17   Vanetta MuldersScott Zackowski, MD  ibuprofen (ADVIL,MOTRIN) 800 MG tablet Take 800 mg by mouth every 8 (eight) hours as needed for moderate pain.    Historical Provider, MD  ondansetron (ZOFRAN) 4 MG tablet Take 1 tablet (4 mg total) by mouth every 6 (six) hours. 01/25/17   Roxy Horsemanobert Browning, PA-C  oxyCODONE-acetaminophen (PERCOCET/ROXICET) 5-325 MG tablet Take 2 tablets by mouth every 6 (six) hours as needed for severe pain. 01/25/17   Roxy Horsemanobert Browning, PA-C   Meds Ordered and Administered this Visit  Medications - No data to display  BP 113/72 (BP Location: Right Arm)   Pulse 76   Temp 98.2 F (36.8 C) (Oral)  Resp 18   LMP 03/18/2017   SpO2 99%  No data found.   Physical Exam  Constitutional: She is oriented to person, place, and time. She appears well-developed and well-nourished.  HENT:  Head: Normocephalic and atraumatic.  Eyes: Conjunctivae and EOM are normal. Pupils are equal, round, and reactive to light.  Neck: Normal range of motion. Neck supple.  Cardiovascular: Normal rate, regular rhythm and normal heart sounds.   Pulmonary/Chest: Effort normal and breath sounds normal.  Abdominal: Soft. Bowel sounds are normal.  Musculoskeletal: Normal range of motion. She exhibits tenderness.  Neurological: She is alert and oriented to person, place, and time.  Skin: Skin is warm and dry.  Nursing note and  vitals reviewed.   Urgent Care Course     Procedures (including critical care time)  Labs Review Labs Reviewed - No data to display  Imaging Review No results found.   Visual Acuity Review  Right Eye Distance:   Left Eye Distance:   Bilateral Distance:    Right Eye Near:   Left Eye Near:    Bilateral Near:         MDM   1. Strain of left shoulder, initial encounter    TTP left shoulder  Naprosyn 500mg  po bid x 7 days #14 Flexeril 5mg  one po tid prn #21    Deatra Canter, FNP 03/21/17 1832    Deatra Canter, FNP 03/21/17 [phone]

## 2017-03-21 NOTE — ED Triage Notes (Signed)
[facility] nausea due to the pain medication that she is taking.

## 2017-03-21 NOTE — ED Triage Notes (Signed)
Pt presents with complaint of excessive vaginal bleeding. Pt reports was seen yesterday for L shoulder pain. States started LMP 5/1 and excessive bleeding started this AM. Pt reports using tampon and pad within an hour. States was pregnant in feb/march and had abortion, states did not have surgical abortion. States normal period in April.

## 2017-03-21 NOTE — ED Notes (Signed)
Pt walked to nurse first and states she is leaving because 'yall have too much going on around here'.

## 2018-01-28 ENCOUNTER — Emergency Department (HOSPITAL_COMMUNITY)
Admission: EM | Admit: 2018-01-28 | Discharge: 2018-01-28 | Disposition: A | Discharge status: Home / Self Care | Payer: Medicaid Other | Attending: Emergency Medicine | Admitting: Emergency Medicine

## 2018-01-28 ENCOUNTER — Encounter (HOSPITAL_COMMUNITY): Payer: Self-pay

## 2018-01-28 ENCOUNTER — Other Ambulatory Visit: Payer: Self-pay

## 2018-01-28 DIAGNOSIS — Y939 Activity, unspecified: Secondary | ICD-10-CM | POA: Diagnosis not present

## 2018-01-28 DIAGNOSIS — Y990 Civilian activity done for income or pay: Secondary | ICD-10-CM | POA: Insufficient documentation

## 2018-01-28 DIAGNOSIS — Y929 Unspecified place or not applicable: Secondary | ICD-10-CM | POA: Diagnosis not present

## 2018-01-28 DIAGNOSIS — F17210 Nicotine dependence, cigarettes, uncomplicated: Secondary | ICD-10-CM | POA: Diagnosis not present

## 2018-01-28 DIAGNOSIS — S161XXA Strain of muscle, fascia and tendon at neck level, initial encounter: Secondary | ICD-10-CM | POA: Diagnosis not present

## 2018-01-28 DIAGNOSIS — M542 Cervicalgia: Secondary | ICD-10-CM | POA: Diagnosis present

## 2018-01-28 DIAGNOSIS — M62838 Other muscle spasm: Secondary | ICD-10-CM | POA: Diagnosis not present

## 2018-01-28 DIAGNOSIS — Z79899 Other long term (current) drug therapy: Secondary | ICD-10-CM | POA: Insufficient documentation

## 2018-01-28 DIAGNOSIS — X500XXA Overexertion from strenuous movement or load, initial encounter: Secondary | ICD-10-CM | POA: Diagnosis not present

## 2018-01-28 DIAGNOSIS — J45909 Unspecified asthma, uncomplicated: Secondary | ICD-10-CM | POA: Diagnosis not present

## 2018-01-28 MED ORDER — NAPROXEN 500 MG PO TABS: 500.0000 mg | ORAL_TABLET | Freq: Two times a day (BID) | ORAL | 0 refills | Status: DC

## 2018-01-28 MED ORDER — CYCLOBENZAPRINE HCL 5 MG PO TABS: 5.0000 mg | ORAL_TABLET | Freq: Every evening | ORAL | 0 refills | Status: DC | PRN

## 2018-01-28 MED ORDER — KETOROLAC TROMETHAMINE 15 MG/ML IJ SOLN
15.0000 mg | Freq: Once | INTRAMUSCULAR | Status: AC
  Administered 2018-01-28: 15 mg via INTRAMUSCULAR
  Filled 2018-01-28: qty 1

## 2018-01-28 NOTE — ED Triage Notes (Signed)
Pt presents for evaluation of posterior neck pain x 2 days. Pt reports she felt like she pulled a muscle 2 days ago and thought it would improve but now is having pain to upper back and shoulders, Difficulty rotating head.

## 2018-01-28 NOTE — ED Provider Notes (Signed)
[facility] EMERGENCY DEPARTMENT Provider Note   CSN: [id number] Arrival date & time: 01/28/18  0745     History   Chief Complaint Chief Complaint  Patient presents with  . Neck Pain    HPI Yvonne Castillo is a 22 y.o. female presenting for evaluation of neck pain.   Pt states her neck pain began a few days ago, after she turned her neck sharply ot the side. It began on the R side, but is now bilateral. Pain is described as a soreness, worse with movements of her head. She denies fall, trauma, or injury. She recently started a new job 2 wks ago, in which she is lifting lots of heavy objects. She has been taking tylenol and BC powerders with mild improvement before pain returns. She denies fevers, chills, URI sxs, CP, SOB, HA, or vision changes. She denies numbness, tingling, loss of bowel or bladder control, h/o CA, or h/o IVDU. Pain improved mildly with heat.  HPI  Past Medical History:  Diagnosis Date  . Anemia   . Asthma     Patient Active Problem List   Diagnosis Date Noted  . Active labor at term 05/11/2014  . Hx of preeclampsia, prior pregnancy, currently pregnant 03/25/2014  . Supervision of normal pregnancy 03/24/2014  . Anemia during pregnancy 03/24/2014  . Asthma 03/21/2014  . Nephrolithiasis 02/18/2014    Past Surgical History:  Procedure Laterality Date  . NO PAST SURGERIES      OB History    Gravida Para Term Preterm AB Living   4 4 3 1   3    SAB TAB Ectopic Multiple Live Births           4       Home Medications    Prior to Admission medications   Medication Sig Start Date End Date Taking? Authorizing Provider  acetaminophen (TYLENOL) 500 MG tablet Take 500 mg by mouth every 6 (six) hours as needed for mild pain.    [provider]  cyclobenzaprine (FLEXERIL) 5 MG tablet Take 1 tablet (5 mg total) by mouth at bedtime as needed for muscle spasms. 01/28/18   Maile Linford, PA-C  HYDROcodone-acetaminophen (NORCO/VICODIN)  5-325 MG tablet Take 1-2 tablets by mouth every 6 (six) hours as needed for moderate pain. 03/20/17   Vanetta Mulders, MD  ibuprofen (ADVIL,MOTRIN) 800 MG tablet Take 800 mg by mouth every 8 (eight) hours as needed for moderate pain.    [provider]  naproxen (NAPROSYN) 500 MG tablet Take 1 tablet (500 mg total) by mouth 2 (two) times daily with a meal. 01/28/18   Samy Ryner, PA-C  ondansetron (ZOFRAN) 4 MG tablet Take 1 tablet (4 mg total) by mouth every 6 (six) hours. 01/25/17   Roxy Horseman, PA-C  oxyCODONE-acetaminophen (PERCOCET/ROXICET) 5-325 MG tablet Take 2 tablets by mouth every 6 (six) hours as needed for severe pain. 01/25/17   Roxy Horseman, PA-C    Family History Family History  Problem Relation Age of Onset  . Anemia Mother   . Depression Mother   . Asthma Brother   . Diabetes Maternal Grandmother   . Heart disease Paternal Grandfather     Social History Social History   Tobacco Use  . Smoking status: Current Some Day Smoker    Packs/day: 0.25    Types: Cigars, Cigarettes  . Smokeless tobacco: Never Used  Substance Use Topics  . Alcohol use: Yes    Alcohol/week: 0.0 oz    Comment: occ  .  Drug use: No     Allergies   Patient has no known allergies.   Review of Systems Review of Systems  Constitutional: Negative for chills and fever.  Musculoskeletal: Positive for neck pain. Negative for back pain.  Skin: Negative for wound.  Neurological: Negative for numbness.     Physical Exam Updated Vital Signs BP 126/76   Pulse 67   Temp 98.3 F (36.8 C) (Oral)   Resp 16   Ht 5\' 2"  (1.575 m)   Wt 65.8 kg (145 lb)   LMP 01/01/2018   SpO2 100%   BMI 26.52 kg/m   Physical Exam  Constitutional: She is oriented to person, place, and time. She appears well-developed and well-nourished. No distress.  HENT:  Head: Normocephalic and atraumatic.  Eyes: EOM are normal.  Neck: Normal range of motion.  Cardiovascular: Normal rate, regular  rhythm and intact distal pulses.  Pulmonary/Chest: Effort normal and breath sounds normal. No respiratory distress. She has no wheezes.  Abdominal: She exhibits no distension.  Musculoskeletal: She exhibits tenderness.  TTP of bilateral trapezius muscles and paracervical muscles. No increased TTP of midline c-spine. Pain with all movements of the head. No TTP of midline spine or lower back. Full active ROM of bilateral upper extremities without pain. Grip strength equal bilaterally. Radial pulses intact bilaterally. Sensation intact bilaterally.   Neurological: She is alert and oriented to person, place, and time. No sensory deficit.  Skin: Skin is warm. No rash noted.  Psychiatric: She has a normal mood and affect.  Nursing note and vitals reviewed.    ED Treatments / Results  Labs (all labs ordered are listed, but only abnormal results are displayed) Labs Reviewed - No data to display  EKG  EKG Interpretation None       Radiology No results found.  Procedures Procedures (including critical care time)  Medications Ordered in ED Medications  ketorolac (TORADOL) 15 MG/ML injection 15 mg (15 mg Intramuscular Given 01/28/18 1034)     Initial Impression / Assessment and Plan / ED Course  I have reviewed the triage vital signs and the nursing notes.  Pertinent labs & imaging results that were available during my care of the patient were reviewed by me and considered in my medical decision making (see chart for details).     Pt presenting for evaluation of neck pain. Physical exam reassuring, she is nervovascularly intact. TTP along musculature. Doubt vertebral injury, infection, compression or myelopathy. Likely muscle soreness/strain. Will tx with nsaids and muscle relaxer. Pt to f/u with ortho as needed. At this time, pt appears safe for discharge. Return precautions give. Pt states she understands and agrees to plan.    Final Clinical Impressions(s) / ED Diagnoses   Final  diagnoses:  Strain of neck muscle, initial encounter  Trapezius muscle spasm    ED Discharge Orders        Ordered    naproxen (NAPROSYN) 500 MG tablet  2 times daily with meals     01/28/18 1021    cyclobenzaprine (FLEXERIL) 5 MG tablet  At bedtime PRN     01/28/18 1021       Kabe Mckoy, PA-C 01/28/18 1401    Little, Ambrose Finlandachel Morgan, MD 01/28/18 1704

## 2018-01-28 NOTE — ED Notes (Signed)
Pt verbalized understanding of discharge instructions and denies any further questions at this time.   

## 2018-01-28 NOTE — Discharge Instructions (Signed)
Take naproxen 2 times a day with meals.  Do not take other anti-inflammatories at the same time open (Advil, Motrin, ibuprofen, Aleve). You may supplement with Tylenol if you need further pain control. Use heating pads as often as possible to help control your pain. Use the muscle relaxer as needed for muscle stiffness or soreness.  Have caution, as this may make you tired or groggy.  Do not drive or operate heavy machinery while taking this medicine. Follow-up with orthopedics if your pain is not improving. Return to the emergency room if you develop fevers, worsening pain, or any new or concerning symptoms.

## 2018-04-22 ENCOUNTER — Encounter: Payer: Self-pay | Admitting: Family Medicine

## 2018-04-22 ENCOUNTER — Ambulatory Visit (INDEPENDENT_AMBULATORY_CARE_PROVIDER_SITE_OTHER): Payer: Self-pay | Admitting: General Practice

## 2018-04-22 DIAGNOSIS — Z3201 Encounter for pregnancy test, result positive: Secondary | ICD-10-CM

## 2018-04-22 LAB — POCT PREGNANCY, URINE: Preg Test, Ur: POSITIVE — AB

## 2018-04-22 NOTE — Progress Notes (Addendum)
Patient presents to office today for UPT. UPT +. Patient reports first positive home test the beginning of May. LMP 01/01/18 EDD 10/08/18 [id number]. Patient denies taking any meds only a children's multivitamin. Recommended she begin prenatal care. Per chart review, patient has history of pre-eclampsia & post partum hypertension.   I have reviewed the note and agree with the documentation and plan. Nolene BernheimERRI BURLESON, RN, MSN, NP-BC Nurse Practitioner, [facility], [facility] 04/22/2018 10:43 AM

## 2018-04-27 ENCOUNTER — Inpatient Hospital Stay (HOSPITAL_COMMUNITY)
Admission: AD | Admit: 2018-04-27 | Discharge: 2018-04-27 | Disposition: A | Discharge status: Home / Self Care | Payer: Self-pay | Source: Ambulatory Visit | Attending: Obstetrics and Gynecology | Admitting: Obstetrics and Gynecology

## 2018-04-27 ENCOUNTER — Encounter (HOSPITAL_COMMUNITY): Payer: Self-pay | Admitting: *Deleted

## 2018-04-27 DIAGNOSIS — Z331 Pregnant state, incidental: Secondary | ICD-10-CM

## 2018-04-27 DIAGNOSIS — O99331 Smoking (tobacco) complicating pregnancy, first trimester: Secondary | ICD-10-CM | POA: Insufficient documentation

## 2018-04-27 DIAGNOSIS — Z818 Family history of other mental and behavioral disorders: Secondary | ICD-10-CM | POA: Insufficient documentation

## 2018-04-27 DIAGNOSIS — Z791 Long term (current) use of non-steroidal anti-inflammatories (NSAID): Secondary | ICD-10-CM | POA: Insufficient documentation

## 2018-04-27 DIAGNOSIS — R0602 Shortness of breath: Secondary | ICD-10-CM | POA: Insufficient documentation

## 2018-04-27 DIAGNOSIS — Z79899 Other long term (current) drug therapy: Secondary | ICD-10-CM | POA: Insufficient documentation

## 2018-04-27 DIAGNOSIS — O99511 Diseases of the respiratory system complicating pregnancy, first trimester: Secondary | ICD-10-CM | POA: Insufficient documentation

## 2018-04-27 DIAGNOSIS — F17210 Nicotine dependence, cigarettes, uncomplicated: Secondary | ICD-10-CM | POA: Insufficient documentation

## 2018-04-27 DIAGNOSIS — Z833 Family history of diabetes mellitus: Secondary | ICD-10-CM | POA: Insufficient documentation

## 2018-04-27 DIAGNOSIS — Z832 Family history of diseases of the blood and blood-forming organs and certain disorders involving the immune mechanism: Secondary | ICD-10-CM | POA: Insufficient documentation

## 2018-04-27 DIAGNOSIS — J069 Acute upper respiratory infection, unspecified: Secondary | ICD-10-CM

## 2018-04-27 DIAGNOSIS — J4520 Mild intermittent asthma, uncomplicated: Secondary | ICD-10-CM

## 2018-04-27 DIAGNOSIS — O26891 Other specified pregnancy related conditions, first trimester: Secondary | ICD-10-CM | POA: Insufficient documentation

## 2018-04-27 DIAGNOSIS — Z3490 Encounter for supervision of normal pregnancy, unspecified, unspecified trimester: Secondary | ICD-10-CM

## 2018-04-27 DIAGNOSIS — Z8249 Family history of ischemic heart disease and other diseases of the circulatory system: Secondary | ICD-10-CM | POA: Insufficient documentation

## 2018-04-27 DIAGNOSIS — O98511 Other viral diseases complicating pregnancy, first trimester: Secondary | ICD-10-CM | POA: Insufficient documentation

## 2018-04-27 LAB — URINALYSIS, ROUTINE W REFLEX MICROSCOPIC
Bilirubin Urine: NEGATIVE
Glucose, UA: NEGATIVE mg/dL
Hgb urine dipstick: NEGATIVE
Ketones, ur: NEGATIVE mg/dL
LEUKOCYTES UA: NEGATIVE
NITRITE: NEGATIVE
PROTEIN: NEGATIVE mg/dL
SPECIFIC GRAVITY, URINE: 1.017 (ref 1.005–1.030)
pH: 7 (ref 5.0–8.0)

## 2018-04-27 LAB — POCT PREGNANCY, URINE: Preg Test, Ur: POSITIVE — AB

## 2018-04-27 MED ORDER — IPRATROPIUM-ALBUTEROL 0.5-2.5 (3) MG/3ML IN SOLN
3.0000 mL | Freq: Four times a day (QID) | RESPIRATORY_TRACT | Status: DC
  Administered 2018-04-27: 3 mL via RESPIRATORY_TRACT
  Filled 2018-04-27: qty 3

## 2018-04-27 MED ORDER — ALBUTEROL SULFATE HFA 108 (90 BASE) MCG/ACT IN AERS: 1.0000 | INHALATION_SPRAY | Freq: Four times a day (QID) | RESPIRATORY_TRACT | 1 refills | Status: DC | PRN

## 2018-04-27 NOTE — MAU Provider Note (Signed)
History     CSN: [id number]  Arrival date and time: 04/27/18 1722  Chief Complaint  Patient presents with  . Shortness of Breath   22 y.o. [id number] in early pregnancy here with SOB. Sx started this afternoon when she got into her hot car. Reports cough and cold sx x3 days. No fevers. She has hx of asthma but hasn't needed inhaler in years. Denies pain or bleeding.   OB History    Gravida  5   Para  4   Term  3   Preterm  1   AB      Living  3     SAB      TAB      Ectopic      Multiple      Live Births  4           Past Medical History:  Diagnosis Date  . Anemia   . Asthma     Past Surgical History:  Procedure Laterality Date  . NO PAST SURGERIES    . WISDOM TOOTH EXTRACTION      Family History  Problem Relation Age of Onset  . Anemia Mother   . Depression Mother   . Asthma Brother   . Diabetes Maternal Grandmother   . Heart disease Paternal Grandfather     Social History   Tobacco Use  . Smoking status: Current Some Day Smoker    Packs/day: 0.25    Types: Cigars, Cigarettes  . Smokeless tobacco: Never Used  Substance Use Topics  . Alcohol use: Not Currently    Alcohol/week: 0.0 oz    Comment: Stopped April 2019  . Drug use: No    Allergies: No Known Allergies  Medications Prior to Admission  Medication Sig Dispense Refill Last Dose  . acetaminophen (TYLENOL) 500 MG tablet Take 500 mg by mouth every 6 (six) hours as needed for mild pain.   prn  . Pediatric Multivitamins-Iron (CHILD CHEWABLE VITAMINS/IRON PO) Take 1 tablet by mouth daily.   Past Week at Unknown time  . Phenyleph-Doxylamine-DM-APAP (NYQUIL SEVERE COLD/FLU) 5-6.25-10-325 MG/15ML LIQD Take 15 mLs by mouth 2 (two) times daily as needed (sore throat).   04/26/2018 at Unknown time  . Throat Lozenges (COUGH DROPS MENTHOL) LOZG Use as directed 1 lozenge in the mouth or throat as needed.   04/27/2018 at Unknown time  . cyclobenzaprine (FLEXERIL) 5 MG tablet Take 1 tablet (5 mg  total) by mouth at bedtime as needed for muscle spasms. 10 tablet 0   . naproxen (NAPROSYN) 500 MG tablet Take 1 tablet (500 mg total) by mouth 2 (two) times daily with a meal. 30 tablet 0     Review of Systems  Constitutional: Negative for fever.  HENT: Positive for congestion and rhinorrhea.   Respiratory: Positive for cough and shortness of breath. Negative for wheezing.   Cardiovascular: Negative for chest pain.  Gastrointestinal: Negative for abdominal pain.  Genitourinary: Negative for vaginal bleeding.   Physical Exam   Blood pressure 115/71, pulse 94, temperature 98.8 F (37.1 C), temperature source Oral, resp. rate (!) 21, height 5\' 3"  (1.6 m), weight 148 lb (67.1 kg), SpO2 99 %.  Physical Exam  Constitutional: She is oriented to person, place, and time. She appears well-developed and well-nourished. No distress.  HENT:  Head: Normocephalic and atraumatic.  Neck: Normal range of motion.  Cardiovascular: Normal rate, regular rhythm and normal heart sounds.  Respiratory: Effort normal. No accessory muscle usage. No respiratory distress.  She has wheezes in the right upper field, the right lower field, the left upper field and the left lower field. She has rales.  GI: Soft. She exhibits no distension and no mass. There is no tenderness. There is no rebound and no guarding.  Musculoskeletal: Normal range of motion.  Neurological: She is alert and oriented to person, place, and time.  Skin: Skin is warm and dry.  Psychiatric: She has a normal mood and affect.   Results for orders placed or performed during the hospital encounter of 04/27/18 (from the past 24 hour(s))  Pregnancy, urine POC     Status: Abnormal   Collection Time: 04/27/18  6:06 PM  Result Value Ref Range   Preg Test, Ur POSITIVE (A) NEGATIVE   MAU Course  Procedures Duoneb  MDM Labs ordered. Asthma exacerbation d/t URV. Wheezing improved after treatment. Pt feels better. Stable for discharge home.    Assessment and Plan   1. Pregnancy at early stage   2. Upper respiratory virus   3. Mild intermittent asthma, unspecified whether complicated    Discharge home Follow up in OB office as scheduled OTC cold meds Rx Proventil inhaler  Allergies as of 04/27/2018   No Known Allergies     Medication List    STOP taking these medications   cyclobenzaprine 5 MG tablet Commonly known as:  FLEXERIL   naproxen 500 MG tablet Commonly known as:  NAPROSYN   NYQUIL SEVERE COLD/FLU 5-6.25-10-325 MG/15ML Liqd Generic drug:  Phenyleph-Doxylamine-DM-APAP     TAKE these medications   acetaminophen 500 MG tablet Commonly known as:  TYLENOL Take 500 mg by mouth every 6 (six) hours as needed for mild pain.   albuterol 108 (90 Base) MCG/ACT inhaler Commonly known as:  PROVENTIL HFA;VENTOLIN HFA Inhale 1-2 puffs into the lungs every 6 (six) hours as needed for wheezing or shortness of breath.   CHILD CHEWABLE VITAMINS/IRON PO Take 1 tablet by mouth daily.   COUGH DROPS MENTHOL Lozg Use as directed 1 lozenge in the mouth or throat as needed.      Donette LarryMelanie Seon Gaertner, CNM 04/27/2018, 6:21 PM

## 2018-04-27 NOTE — Discharge Instructions (Signed)
Asthma, Acute Bronchospasm Acute bronchospasm caused by asthma is also referred to as an asthma attack. Bronchospasm means your air passages become narrowed. The narrowing is caused by inflammation and tightening of the muscles in the air tubes (bronchi) in your lungs. This can make it hard to breathe or cause you to wheeze and cough. What are the causes? Possible triggers are:  Animal dander from the skin, hair, or feathers of animals.  Dust mites contained in house dust.  Cockroaches.  Pollen from trees or grass.  Mold.  Cigarette or tobacco smoke.  Air pollutants such as dust, household cleaners, hair sprays, aerosol sprays, paint fumes, strong chemicals, or strong odors.  Cold air or weather changes. Cold air may trigger inflammation. Winds increase molds and pollens in the air.  Strong emotions such as crying or laughing hard.  Stress.  Certain medicines such as aspirin or beta-blockers.  Sulfites in foods and drinks, such as dried fruits and wine.  Infections or inflammatory conditions, such as a flu, cold, or inflammation of the nasal membranes (rhinitis).  Gastroesophageal reflux disease (GERD). GERD is a condition where stomach acid backs up into your esophagus.  Exercise or strenuous activity.  What are the signs or symptoms?  Wheezing.  Excessive coughing, particularly at night.  Chest tightness.  Shortness of breath. How is this diagnosed? Your health care provider will ask you about your medical history and perform a physical exam. A chest X-ray or blood testing may be performed to look for other causes of your symptoms or other conditions that may have triggered your asthma attack. How is this treated? Treatment is aimed at reducing inflammation and opening up the airways in your lungs. Most asthma attacks are treated with inhaled medicines. These include quick relief or rescue medicines (such as bronchodilators) and controller medicines (such as inhaled  corticosteroids). These medicines are sometimes given through an inhaler or a nebulizer. Systemic steroid medicine taken by mouth or given through an IV tube also can be used to reduce the inflammation when an attack is moderate or severe. Antibiotic medicines are only used if a bacterial infection is present. Follow these instructions at home:  Rest.  Drink plenty of liquids. This helps the mucus to remain thin and be easily coughed up. Only use caffeine in moderation and do not use alcohol until you have recovered from your illness.  Do not smoke. Avoid being exposed to secondhand smoke.  You play a critical role in keeping yourself in good health. Avoid exposure to things that cause you to wheeze or to have breathing problems.  Keep your medicines up-to-date and available. Carefully follow your health care providers treatment plan.  Take your medicine exactly as prescribed.  When pollen or pollution is bad, keep windows closed and use an air conditioner or go to places with air conditioning.  Asthma requires careful medical care. See your health care provider for a follow-up as advised. If you are more than [redacted] weeks pregnant and you were prescribed any new medicines, let your obstetrician know about the visit and how you are doing. Follow up with your health care provider as directed.  After you have recovered from your asthma attack, make an appointment with your outpatient doctor to talk about ways to reduce the likelihood of future attacks. If you do not have a doctor who manages your asthma, make an appointment with a primary care doctor to discuss your asthma. Get help right away if:  You are getting worse.  You have trouble breathing. If severe, call your local emergency services (911 in the [place]  You develop chest pain or discomfort.  You are vomiting.  You are not able to keep fluids down.  You are coughing up yellow, green, brown, or bloody sputum.  You have a fever  and your symptoms suddenly get worse.  You have trouble swallowing. This information is not intended to replace advice given to you by your health care provider. Make sure you discuss any questions you have with your health care provider. Document Released: 02/19/2007 Document Revised: 04/17/2016 Document Reviewed: 05/12/2013 Elsevier Interactive Patient Education  2017 Elsevier Inc.  Upper Respiratory Infection, Adult Most upper respiratory infections (URIs) are caused by a virus. A URI affects the nose, throat, and upper air passages. The most common type of URI is often called "the common cold." Follow these instructions at home:  Take medicines only as told by your doctor.  Gargle warm saltwater or take cough drops to comfort your throat as told by your doctor.  Use a warm mist humidifier or inhale steam from a shower to increase air moisture. This may make it easier to breathe.  Drink enough fluid to keep your pee (urine) clear or pale yellow.  Eat soups and other clear broths.  Have a healthy diet.  Rest as needed.  Go back to work when your fever is gone or your doctor says it is okay. ? You may need to stay home longer to avoid giving your URI to others. ? You can also wear a face mask and wash your hands often to prevent spread of the virus.  Use your inhaler more if you have asthma.  Do not use any tobacco products, including cigarettes, chewing tobacco, or electronic cigarettes. If you need help quitting, ask your doctor. Contact a doctor if:  You are getting worse, not better.  Your symptoms are not helped by medicine.  You have chills.  You are getting more short of breath.  You have brown or red mucus.  You have yellow or brown discharge from your nose.  You have pain in your face, especially when you bend forward.  You have a fever.  You have puffy (swollen) neck glands.  You have pain while swallowing.  You have white areas in the back of your  throat. Get help right away if:  You have very bad or constant: ? Headache. ? Ear pain. ? Pain in your forehead, behind your eyes, and over your cheekbones (sinus pain). ? Chest pain.  You have long-lasting (chronic) lung disease and any of the following: ? Wheezing. ? Long-lasting cough. ? Coughing up blood. ? A change in your usual mucus.  You have a stiff neck.  You have changes in your: ? Vision. ? Hearing. ? Thinking. ? Mood. This information is not intended to replace advice given to you by your health care provider. Make sure you discuss any questions you have with your health care provider. Document Released: 04/22/2008 Document Revised: 07/07/2016 Document Reviewed: 02/09/2014 Elsevier Interactive Patient Education  2018 [employer]

## 2018-04-27 NOTE — MAU Note (Signed)
Pt reports she has been short of breath for 1-2 days, worsening today.

## 2018-04-27 NOTE — Progress Notes (Signed)
Respiratory notified regarding order for nebulizer treatment.

## 2018-05-11 ENCOUNTER — Encounter: Payer: Self-pay | Admitting: Obstetrics and Gynecology

## 2018-05-11 ENCOUNTER — Encounter: Payer: Self-pay | Admitting: General Practice

## 2018-05-11 ENCOUNTER — Ambulatory Visit (INDEPENDENT_AMBULATORY_CARE_PROVIDER_SITE_OTHER): Payer: Self-pay | Admitting: Obstetrics and Gynecology

## 2018-05-11 VITALS — BP 114/62 | HR 79 | Wt 145.5 lb

## 2018-05-11 DIAGNOSIS — O09292 Supervision of pregnancy with other poor reproductive or obstetric history, second trimester: Secondary | ICD-10-CM

## 2018-05-11 DIAGNOSIS — Z113 Encounter for screening for infections with a predominantly sexual mode of transmission: Secondary | ICD-10-CM

## 2018-05-11 DIAGNOSIS — O0992 Supervision of high risk pregnancy, unspecified, second trimester: Secondary | ICD-10-CM | POA: Insufficient documentation

## 2018-05-11 DIAGNOSIS — O09299 Supervision of pregnancy with other poor reproductive or obstetric history, unspecified trimester: Secondary | ICD-10-CM

## 2018-05-11 DIAGNOSIS — Z124 Encounter for screening for malignant neoplasm of cervix: Secondary | ICD-10-CM

## 2018-05-11 DIAGNOSIS — J4520 Mild intermittent asthma, uncomplicated: Secondary | ICD-10-CM

## 2018-05-11 DIAGNOSIS — Z3482 Encounter for supervision of other normal pregnancy, second trimester: Secondary | ICD-10-CM

## 2018-05-11 LAB — POCT URINALYSIS DIP (DEVICE)
BILIRUBIN URINE: NEGATIVE
GLUCOSE, UA: NEGATIVE mg/dL
KETONES UR: NEGATIVE mg/dL
LEUKOCYTES UA: NEGATIVE
NITRITE: NEGATIVE
Protein, ur: NEGATIVE mg/dL
Specific Gravity, Urine: 1.02 (ref 1.005–1.030)
Urobilinogen, UA: 0.2 mg/dL (ref 0.0–1.0)
pH: 7.5 (ref 5.0–8.0)

## 2018-05-11 MED ORDER — ASPIRIN EC 81 MG PO TBEC: 81.0000 mg | DELAYED_RELEASE_TABLET | Freq: Every day | ORAL | 2 refills | Status: DC

## 2018-05-11 MED ORDER — PREPLUS 27-1 MG PO TABS: 1.0000 | ORAL_TABLET | Freq: Every day | ORAL | 13 refills | Status: DC

## 2018-05-11 NOTE — Progress Notes (Signed)
Subjective:  Yvonne Castillo is a 22 y.o. [id number] at 48w4dbeing seen today for her first OB visit. ? LMP 01/01/18 with EDD of 10/08/18. H/O PP PEC after first pregnancy. No problems with PEC with sequent pregnancies. H/O Asthma, PRN MDI and H/O Kidney stones.   She is currently monitored for the following issues for this high-risk pregnancy and has Nephrolithiasis; Asthma; Supervision of normal pregnancy; Hx of preeclampsia, prior pregnancy, currently pregnant; and Supervision of high risk pregnancy, antepartum, second trimester on their problem list.  Patient reports no complaints.  Contractions: Not present. Vag. Bleeding: Scant.  Movement: Absent. Denies leaking of fluid.   The following portions of the patient's history were reviewed and updated as appropriate: allergies, current medications, past family history, past medical history, past social history, past surgical history and problem list. Problem list updated.  Objective:   Vitals:   05/11/18 1429  BP: 114/62  Pulse: 79  Weight: 145 lb 8 oz (66 kg)    Fetal Status: Fetal Heart Rate (bpm): 156   Movement: Absent     General:  Alert, oriented and cooperative. Patient is in no acute distress.  Skin: Skin is warm and dry. No rash noted.   Cardiovascular: Normal heart rate noted  Respiratory: Normal respiratory effort, no problems with respiration noted  Abdomen: Soft, gravid, appropriate for gestational age. Pain/Pressure: Present     Pelvic:  Cervical exam performed        Extremities: Normal range of motion.  Edema: None  Mental Status: Normal mood and affect. Normal behavior. Normal judgment and thought content.   Urinalysis:      Assessment and Plan:  Pregnancy: [id number] 159w4d1. Encounter for supervision of other normal pregnancy in second trimester Prenatal care and labs reviewed with pt - POCT urinalysis dip (device) - [place]FM OB COMP + 14 WK; Future - Prenatal Vit-Fe Fumarate-FA (PREPLUS) 27-1 MG TABS; Take 1 tablet by  mouth daily.  Dispense: 30 tablet; Refill: 13  2. Hx of preeclampsia, prior pregnancy, currently pregnant Indications for BASA reviewed with - Comp Met (CMET) - Protein / creatinine ratio, urine - aspirin EC 81 MG tablet; Take 1 tablet (81 mg total) by mouth daily. Take after 12 weeks for prevention of preeclampsia later in pregnancy  Dispense: 300 tablet; Refill: 2  3. Mild intermittent asthma, unspecified whether complicated Renewed Albuterol MDI for PRN use   Preterm labor symptoms and general obstetric precautions including but not limited to vaginal bleeding, contractions, leaking of fluid and fetal movement were reviewed in detail with the patient. Please refer to After Visit Summary for other counseling recommendations.  No follow-ups on file.   ErChancy MilroyMD

## 2018-05-11 NOTE — Patient Instructions (Addendum)
BENEFITS OF BREASTFEEDING Many women wonder if they should breastfeed. Research shows that breast milk contains the perfect balance of vitamins, protein and fat that your baby needs to grow. It also contains antibodies that help your baby's immune system to fight off viruses and bacteria and can reduce the risk of sudden infant death syndrome (SIDS). In addition, the colostrum (a fluid secreted from the breast in the first few days after delivery) helps your newborn's digestive system to grow and function well. Breast milk is easier to digest than formula. Also, if your baby is born preterm, breast milk can help to reduce both short- and long-term health problems. BENEFITS OF BREASTFEEDING FOR MOM . Breastfeeding causes a hormone to be released that helps the uterus to contract and return to its normal size more quickly. . It aids in postpartum weight loss, reduces risk of breast and ovarian cancer, heart disease and rheumatoid arthritis. . It decreases the amount of bleeding after the baby is born. benefits of breastfeeding for baby . Provides comfort and nutrition . Protects baby against - Obesity - Diabetes - Asthma - Childhood cancers - Heart disease - Ear infections - Diarrhea - Pneumonia - Stomach problems - Serious allergies - Skin rashes . Promotes growth and development . Reduces the risk of baby having Sudden Infant Death Syndrome (SIDS) only breastmilk for the first 6 months . Protects baby against diseases/allergies . It's the perfect amount for tiny bellies . It restores baby's energy . Provides the best nutrition for baby . Giving water or formula can make baby more likely to get sick, decrease Mom's milk supply, make baby less content with breastfeeding Skin to Skin After delivery, the staff will place your baby on your chest. This helps with the following: . Regulates baby's temperature, breathing, heart rate and blood sugar . Increases Mom's milk supply . Promotes  bonding . Keeps baby and Mom calm and decreases baby's crying Rooming In Your baby will stay in your room with you for the entire time you are in the hospital. This helps with the following: . Allows Mom to learn baby's feeding cues - Fluttering eyes - Sucking on tongue or hand - Rooting (opens mouth and turns head) - Nuzzling into the breast - Bringing hand to mouth . Allows breastfeeding on demand (when your baby is ready) . Helps baby to be calm and content . Ensures a good milk supply . Prevents complications with breastfeeding . Allows parents to learn to care for baby . Allows you to request assistance with breastfeeding Importance of a good latch . Increases milk transfer to baby - baby gets enough milk . Ensures you have enough milk for your baby . Decreases nipple soreness . Don't use pacifiers and bottles - these cause baby to suck differently than breastfeeding . Promotes continuation of breastfeeding Risks of Formula Supplementation with Breastfeeding Giving your infant formula in addition to your breast-milk EXCEPT when medically necessary can lead to: . Decreases your milk supply  . Loss of confidence in yourself for providing baby's nutrition  . Engorgement and possibly mastitis  . Asthma & allergies in the baby BREASTFEEDING FAQS How long should I breastfeed my baby? It is recommended that you provide your baby with breast milk only for the first 6 months and then continue for the first year and longer as desired. During the first few weeks after birth, your baby will need to feed 8-12 times every 24 hours, or every 2-3 hours. They will likely feed   for 15-30 minutes. How can I help my baby begin breastfeeding? Babies are born with an instinct to breastfeed. A healthy baby can begin breastfeeding right away without specific help. At the hospital, a nurse (or lactation consultant) will help you begin the process and will give you tips on good positioning. It may be  helpful to take a breastfeeding class before you deliver in order to know what to expect. How can I help my baby latch on? In order to assist your baby in latching-on, cup your breast in your hand and stroke your baby's lower lip with your nipple to stimulate your baby's rooting reflex. Your baby will look like he or she is yawning, at which point you should bring the baby towards your breast, while aiming the nipple at the roof of his or her mouth. Remember to bring the baby towards you and not your breast towards the baby. How can I tell if my baby is latched-on? Your baby will have all of your nipple and part of the dark area around the nipple in his or her mouth and your baby's nose will be touching your breast. You should see or hear the baby swallowing. If the baby is not latched-on properly, start the process over. To remove the suction, insert a clean finger between your breast and the baby's mouth. Should I switch breasts during feeding? After feeding on one side, switch the baby to your other breast. If he or she does not continue feeding - that is OK. Your baby will not necessarily need to feed from both breasts in a single feeding. On the next feeding, start with the other breast for efficiency and comfort. How can I tell if my baby is hungry? When your baby is hungry, they will nuzzle against your breast, make sucking noises and tongue motions and may put their hands near their mouth. Crying is a late sign of hunger, so you should not wait until this point. When they have received enough milk, they will unlatch from the breast. Is it okay to use a pacifier? Until your baby gets the hang of breastfeeding, experts recommend limiting pacifier usage. If you have questions about this, please contact your pediatrician. What can I do to ensure proper nutrition while breastfeeding? . Make sure that you support your own health and your baby's by eating a healthy, well-balanced diet . Your provider  may recommend that you continue to take your prenatal vitamin . Drink plenty of fluids. It is a good rule to drink one glass of water before or after feeding . Alcohol will remain in the breast milk for as long as it will remain in the blood stream. If you choose to have a drink, it is recommended that you wait at least 2 hours before feeding . Moderate amounts of caffeine are OK . Some over-the-counter or prescription medications are not recommended during breastfeeding. Check with your provider if you have questions What types of birth control methods are safe while breastfeeding? Progestin-only methods, including a daily pill, an IUD, the implant and the injection are safe while breastfeeding. Methods that contain estrogen (such as combination birth control pills, the vaginal ring and the patch) should not be used during the first month of breastfeeding as these can decrease your milk supply.  Places to have your son circumcised:    [facility] [phone] while you are in hospital  [facility] [phone] $244 by 4 wks  [facility] [phone] $175 by 2 wks  [facility] [phone] $250 by 7 days [facility] [phone] $269 by 4 wks  These prices sometimes change but are roughly what you can expect to pay. Please call and confirm pricing.   Circumcision is considered an elective/non-medically necessary procedure. There are many reasons parents decide to have their sons circumsized. During the first year of life circumcised males have a reduced risk of urinary tract infections but after this year the rates between circumcised males and uncircumcised males are the same.  It is safe to have your son circumcised outside of the hospital and the places above perform them regularly.   Deciding about Circumcision in Baby Boys   (Up-to-date The Basics)  What is circumcision?  Circumcision is a surgery that removes the skin that covers the tip of the penis, called the "foreskin" Circumcision is usually done when a boy is between 46 and 96 days old. In the [place], circumcision is common. In some other countries, fewer boys are circumcised. Circumcision is a common tradition in some religions.  Should I have my baby boy circumcised?  There is no easy answer. Circumcision has some benefits. But it also has risks. After talking with your doctor, you will have to decide for yourself what is right for your family.  What are the benefits of circumcision?  .cwh  Circumcised boys seem to have slightly lower rates of: ?Urinary tract infections ?Swelling of the opening at the tip of the penis Circumcised men seem to have slightly lower rates of: ?Urinary tract infections ?Swelling of the opening at the tip of the penis ?Penis cancer ?HIV and other infections that you catch during sex ?Cervical cancer in the women they have sex with Even so, in the [place], the risks of these problems are small - even in boys and men who have not been circumcised. Plus, boys and men who are not circumcised can reduce these extra risks by: ?Cleaning their penis well ?Using condoms during sex  What are the risks of circumcision?  Risks include: ?Bleeding or infection from the surgery ?Damage to or amputation of the penis ?A chance that the doctor will cut off too much or not enough of the foreskin ?A chance that sex won't feel as good later in life Only about 1 out of every 200 circumcisions leads to problems. There is also a chance that your health insurance won't pay for circumcision.  How is circumcision done in baby boys?  First, the baby gets medicine for pain relief. This might be a cream on the skin or a shot into the base of the penis. Next, the doctor cleans the baby's penis well. Then he or she uses special  tools to cut off the foreskin. Finally, the doctor wraps a bandage (called gauze) around the baby's penis. If you have your baby circumcised, his doctor or nurse will give you instructions on how to care for him after the surgery. It is important that you follow those instructions carefully.  Second Trimester of Pregnancy The second trimester is from week 14 through week 27 (months 4 through 6). The second trimester is often a time when you feel your best. Your body has adjusted to being pregnant, and you begin to feel better physically. Usually, morning sickness has lessened or quit completely, you may have more energy, and you may have an increase in appetite. The second trimester is also a time when the fetus is growing rapidly. At the end of the sixth month, the fetus is about 23  inches long and weighs about 1 pounds. You will likely begin to feel the baby move (quickening) between 16 and 20 weeks of pregnancy. Body changes during your second trimester Your body continues to go through many changes during your second trimester. The changes vary from woman to woman.  Your weight will continue to increase. You will notice your lower abdomen bulging out.  You may begin to get stretch marks on your hips, abdomen, and breasts.  You may develop headaches that can be relieved by medicines. The medicines should be approved by your health care provider.  You may urinate more often because the fetus is pressing on your bladder.  You may develop or continue to have heartburn as a result of your pregnancy.  You may develop constipation because certain hormones are causing the muscles that push waste through your intestines to slow down.  You may develop hemorrhoids or swollen, bulging veins (varicose veins).  You may have back pain. This is caused by: ? Weight gain. ? Pregnancy hormones that are relaxing the joints in your pelvis. ? A shift in weight and the muscles that support your  balance.  Your breasts will continue to grow and they will continue to become tender.  Your gums may bleed and may be sensitive to brushing and flossing.  Dark spots or blotches (chloasma, mask of pregnancy) may develop on your face. This will likely fade after the baby is born.  A dark line from your belly button to the pubic area (linea nigra) may appear. This will likely fade after the baby is born.  You may have changes in your hair. These can include thickening of your hair, rapid growth, and changes in texture. Some women also have hair loss during or after pregnancy, or hair that feels dry or thin. Your hair will most likely return to normal after your baby is born.  What to expect at prenatal visits During a routine prenatal visit:  You will be weighed to make sure you and the fetus are growing normally.  Your blood pressure will be taken.  Your abdomen will be measured to track your baby's growth.  The fetal heartbeat will be listened to.  Any test results from the previous visit will be discussed.  Your health care provider may ask you:  How you are feeling.  If you are feeling the baby move.  If you have had any abnormal symptoms, such as leaking fluid, bleeding, severe headaches, or abdominal cramping.  If you are using any tobacco products, including cigarettes, chewing tobacco, and electronic cigarettes.  If you have any questions.  Other tests that may be performed during your second trimester include:  Blood tests that check for: ? Low iron levels (anemia). ? High blood sugar that affects pregnant women (gestational diabetes) between 3924 and 28 weeks. ? Rh antibodies. This is to check for a protein on red blood cells (Rh factor).  Urine tests to check for infections, diabetes, or protein in the urine.  An ultrasound to confirm the proper growth and development of the baby.  An amniocentesis to check for possible genetic problems.  Fetal screens for  spina bifida and Down syndrome.  HIV (human immunodeficiency virus) testing. Routine prenatal testing includes screening for HIV, unless you choose not to have this test.  Follow these instructions at home: Medicines  Follow your health care provider's instructions regarding medicine use. Specific medicines may be either safe or unsafe to take during pregnancy.  Take a prenatal  vitamin that contains at least 600 micrograms (mcg) of folic acid.  If you develop constipation, try taking a stool softener if your health care provider approves. Eating and drinking  Eat a balanced diet that includes fresh fruits and vegetables, whole grains, good sources of protein such as meat, eggs, or tofu, and low-fat dairy. Your health care provider will help you determine the amount of weight gain that is right for you.  Avoid raw meat and uncooked cheese. These carry germs that can cause birth defects in the baby.  If you have low calcium intake from food, talk to your health care provider about whether you should take a daily calcium supplement.  Limit foods that are high in fat and processed sugars, such as fried and sweet foods.  To prevent constipation: ? Drink enough fluid to keep your urine clear or pale yellow. ? Eat foods that are high in fiber, such as fresh fruits and vegetables, whole grains, and beans. Activity  Exercise only as directed by your health care provider. Most women can continue their usual exercise routine during pregnancy. Try to exercise for 30 minutes at least 5 days a week. Stop exercising if you experience uterine contractions.  Avoid heavy lifting, wear low heel shoes, and practice good posture.  A sexual relationship may be continued unless your health care provider directs you otherwise. Relieving pain and discomfort  Wear a good support bra to prevent discomfort from breast tenderness.  Take warm sitz baths to soothe any pain or discomfort caused by hemorrhoids.  Use hemorrhoid cream if your health care provider approves.  Rest with your legs elevated if you have leg cramps or low back pain.  If you develop varicose veins, wear support hose. Elevate your feet for 15 minutes, 3-4 times a day. Limit salt in your diet. Prenatal Care  Write down your questions. Take them to your prenatal visits.  Keep all your prenatal visits as told by your health care provider. This is important. Safety  Wear your seat belt at all times when driving.  Make a list of emergency phone numbers, including numbers for family, friends, the hospital, and police and fire departments. General instructions  Ask your health care provider for a referral to a local prenatal education class. Begin classes no later than the beginning of month 6 of your pregnancy.  Ask for help if you have counseling or nutritional needs during pregnancy. Your health care provider can offer advice or refer you to specialists for help with various needs.  Do not use hot tubs, steam rooms, or saunas.  Do not douche or use tampons or scented sanitary pads.  Do not cross your legs for long periods of time.  Avoid cat litter boxes and soil used by cats. These carry germs that can cause birth defects in the baby and possibly loss of the fetus by miscarriage or stillbirth.  Avoid all smoking, herbs, alcohol, and unprescribed drugs. Chemicals in these products can affect the formation and growth of the baby.  Do not use any products that contain nicotine or tobacco, such as cigarettes and e-cigarettes. If you need help quitting, ask your health care provider.  Visit your dentist if you have not gone yet during your pregnancy. Use a soft toothbrush to brush your teeth and be gentle when you floss. Contact a health care provider if:  You have dizziness.  You have mild pelvic cramps, pelvic pressure, or nagging pain in the abdominal area.  You have persistent  nausea, vomiting, or diarrhea.  You  have a bad smelling vaginal discharge.  You have pain when you urinate. Get help right away if:  You have a fever.  You are leaking fluid from your vagina.  You have spotting or bleeding from your vagina.  You have severe abdominal cramping or pain.  You have rapid weight gain or weight loss.  You have shortness of breath with chest pain.  You notice sudden or extreme swelling of your face, hands, ankles, feet, or legs.  You have not felt your baby move in over an hour.  You have severe headaches that do not go away when you take medicine.  You have vision changes. Summary  The second trimester is from week 14 through week 27 (months 4 through 6). It is also a time when the fetus is growing rapidly.  Your body goes through many changes during pregnancy. The changes vary from woman to woman.  Avoid all smoking, herbs, alcohol, and unprescribed drugs. These chemicals affect the formation and growth your baby.  Do not use any tobacco products, such as cigarettes, chewing tobacco, and e-cigarettes. If you need help quitting, ask your health care provider.  Contact your health care provider if you have any questions. Keep all prenatal visits as told by your health care provider. This is important. This information is not intended to replace advice given to you by your health care provider. Make sure you discuss any questions you have with your health care provider. Document Released: 10/29/2001 Document Revised: 12/10/2016 Document Reviewed: 12/10/2016 Elsevier Interactive Patient Education  [employer].

## 2018-05-11 NOTE — Addendum Note (Signed)
Addended by: Henrietta DineNEAL, Deiontae Rabel S on: 05/11/2018 02:55 PM   Modules accepted: Orders

## 2018-05-12 LAB — COMPREHENSIVE METABOLIC PANEL
ALT: 8 IU/L (ref 0–32)
AST: 12 IU/L (ref 0–40)
Albumin/Globulin Ratio: 1.6 (ref 1.2–2.2)
Albumin: 3.9 g/dL (ref 3.5–5.5)
Alkaline Phosphatase: 65 IU/L (ref 39–117)
BUN/Creatinine Ratio: 9 (ref 9–23)
BUN: 4 mg/dL — AB (ref 6–20)
Bilirubin Total: 0.3 mg/dL (ref 0.0–1.2)
CO2: 22 mmol/L (ref 20–29)
Calcium: 9.4 mg/dL (ref 8.7–10.2)
Chloride: 103 mmol/L (ref 96–106)
Creatinine, Ser: 0.45 mg/dL — ABNORMAL LOW (ref 0.57–1.00)
GFR calc Af Amer: 162 mL/min/{1.73_m2} (ref >59)
GFR calc non Af Amer: 141 mL/min/{1.73_m2} (ref >59)
GLUCOSE: 72 mg/dL (ref 65–99)
Globulin, Total: 2.5 g/dL (ref 1.5–4.5)
POTASSIUM: 3.6 mmol/L (ref 3.5–5.2)
Sodium: 139 mmol/L (ref 134–144)
TOTAL PROTEIN: 6.4 g/dL (ref 6.0–8.5)

## 2018-05-12 LAB — PROTEIN / CREATININE RATIO, URINE
Creatinine, Urine: 110 mg/dL
PROTEIN UR: 4.7 mg/dL
PROTEIN/CREAT RATIO: 43 mg/g{creat} (ref 0–200)

## 2018-05-13 LAB — CYTOLOGY - PAP
Chlamydia: NEGATIVE
Diagnosis: NEGATIVE
NEISSERIA GONORRHEA: NEGATIVE

## 2018-05-19 ENCOUNTER — Ambulatory Visit (HOSPITAL_COMMUNITY)
Admission: RE | Admit: 2018-05-19 | Discharge: 2018-05-19 | Disposition: A | Discharge status: Home / Self Care | Payer: Self-pay | Source: Ambulatory Visit | Attending: Obstetrics and Gynecology | Admitting: Obstetrics and Gynecology

## 2018-05-19 DIAGNOSIS — Z3A16 16 weeks gestation of pregnancy: Secondary | ICD-10-CM | POA: Insufficient documentation

## 2018-05-19 DIAGNOSIS — Z362 Encounter for other antenatal screening follow-up: Secondary | ICD-10-CM

## 2018-05-19 DIAGNOSIS — Z3482 Encounter for supervision of other normal pregnancy, second trimester: Secondary | ICD-10-CM | POA: Insufficient documentation

## 2018-05-27 ENCOUNTER — Other Ambulatory Visit (HOSPITAL_COMMUNITY): Payer: Self-pay | Admitting: *Deleted

## 2018-05-27 DIAGNOSIS — Z362 Encounter for other antenatal screening follow-up: Secondary | ICD-10-CM

## 2018-06-09 ENCOUNTER — Encounter: Payer: Self-pay | Admitting: Student

## 2018-06-09 ENCOUNTER — Ambulatory Visit (HOSPITAL_COMMUNITY)
Admission: RE | Admit: 2018-06-09 | Discharge: 2018-06-09 | Disposition: A | Discharge status: Home / Self Care | Payer: Self-pay | Source: Ambulatory Visit

## 2018-06-09 ENCOUNTER — Encounter (HOSPITAL_COMMUNITY): Payer: Self-pay

## 2018-11-22 ENCOUNTER — Emergency Department (HOSPITAL_COMMUNITY): Payer: Self-pay

## 2018-11-22 ENCOUNTER — Other Ambulatory Visit: Payer: Self-pay

## 2018-11-22 ENCOUNTER — Emergency Department (HOSPITAL_COMMUNITY)
Admission: EM | Admit: 2018-11-22 | Discharge: 2018-11-22 | Disposition: A | Discharge status: Home / Self Care | Payer: Self-pay | Attending: Emergency Medicine | Admitting: Emergency Medicine

## 2018-11-22 DIAGNOSIS — Z3A17 17 weeks gestation of pregnancy: Secondary | ICD-10-CM | POA: Insufficient documentation

## 2018-11-22 DIAGNOSIS — Z79899 Other long term (current) drug therapy: Secondary | ICD-10-CM | POA: Insufficient documentation

## 2018-11-22 DIAGNOSIS — Z7982 Long term (current) use of aspirin: Secondary | ICD-10-CM | POA: Insufficient documentation

## 2018-11-22 DIAGNOSIS — N898 Other specified noninflammatory disorders of vagina: Secondary | ICD-10-CM | POA: Insufficient documentation

## 2018-11-22 DIAGNOSIS — N23 Unspecified renal colic: Secondary | ICD-10-CM | POA: Insufficient documentation

## 2018-11-22 DIAGNOSIS — O99332 Smoking (tobacco) complicating pregnancy, second trimester: Secondary | ICD-10-CM | POA: Insufficient documentation

## 2018-11-22 DIAGNOSIS — O99512 Diseases of the respiratory system complicating pregnancy, second trimester: Secondary | ICD-10-CM | POA: Insufficient documentation

## 2018-11-22 DIAGNOSIS — F17210 Nicotine dependence, cigarettes, uncomplicated: Secondary | ICD-10-CM | POA: Insufficient documentation

## 2018-11-22 DIAGNOSIS — R1032 Left lower quadrant pain: Secondary | ICD-10-CM

## 2018-11-22 DIAGNOSIS — O9989 Other specified diseases and conditions complicating pregnancy, childbirth and the puerperium: Secondary | ICD-10-CM | POA: Insufficient documentation

## 2018-11-22 LAB — URINALYSIS, ROUTINE W REFLEX MICROSCOPIC
BACTERIA UA: NONE SEEN
Bilirubin Urine: NEGATIVE
Glucose, UA: NEGATIVE mg/dL
KETONES UR: NEGATIVE mg/dL
Leukocytes, UA: NEGATIVE
Nitrite: NEGATIVE
PROTEIN: 30 mg/dL — AB
RBC / HPF: >50 RBC/hpf — ABNORMAL HIGH (ref 0–5)
Specific Gravity, Urine: 1.016 (ref 1.005–1.030)
pH: 6 (ref 5.0–8.0)

## 2018-11-22 LAB — COMPREHENSIVE METABOLIC PANEL
ALT: 10 U/L (ref 0–44)
AST: 15 U/L (ref 15–41)
Albumin: 3.4 g/dL — ABNORMAL LOW (ref 3.5–5.0)
Alkaline Phosphatase: 51 U/L (ref 38–126)
Anion gap: 9 (ref 5–15)
BUN: 7 mg/dL (ref 6–20)
CO2: 22 mmol/L (ref 22–32)
CREATININE: 0.47 mg/dL (ref 0.44–1.00)
Calcium: 9 mg/dL (ref 8.9–10.3)
Chloride: 106 mmol/L (ref 98–111)
GFR calc Af Amer: >60 mL/min (ref >60)
GFR calc non Af Amer: >60 mL/min (ref >60)
Glucose, Bld: 109 mg/dL — ABNORMAL HIGH (ref 70–99)
Potassium: 3.3 mmol/L — ABNORMAL LOW (ref 3.5–5.1)
SODIUM: 137 mmol/L (ref 135–145)
Total Bilirubin: 0.3 mg/dL (ref 0.3–1.2)
Total Protein: 6.5 g/dL (ref 6.5–8.1)

## 2018-11-22 LAB — CBC
HEMATOCRIT: 38.3 % (ref 36.0–46.0)
Hemoglobin: 12.8 g/dL (ref 12.0–15.0)
MCH: 31.3 pg (ref 26.0–34.0)
MCHC: 33.4 g/dL (ref 30.0–36.0)
MCV: 93.6 fL (ref 80.0–100.0)
Platelets: 222 10*3/uL (ref 150–400)
RBC: 4.09 MIL/uL (ref 3.87–5.11)
RDW: 14.6 % (ref 11.5–15.5)
WBC: 11.6 10*3/uL — ABNORMAL HIGH (ref 4.0–10.5)
nRBC: 0 % (ref 0.0–0.2)

## 2018-11-22 LAB — WET PREP, GENITAL
Clue Cells Wet Prep HPF POC: NONE SEEN
Sperm: NONE SEEN
Trich, Wet Prep: NONE SEEN
Yeast Wet Prep HPF POC: NONE SEEN

## 2018-11-22 LAB — HCG, QUANTITATIVE, PREGNANCY: HCG, BETA CHAIN, QUANT, S: 29418 m[IU]/mL — AB (ref <5)

## 2018-11-22 LAB — LIPASE, BLOOD: Lipase: 25 U/L (ref 11–51)

## 2018-11-22 LAB — ABO/RH: ABO/RH(D): O POS

## 2018-11-22 MED ORDER — SODIUM CHLORIDE 0.9 % IV BOLUS
1000.0000 mL | Freq: Once | INTRAVENOUS | Status: AC
  Administered 2018-11-22: 1000 mL via INTRAVENOUS

## 2018-11-22 MED ORDER — FENTANYL CITRATE (PF) 100 MCG/2ML IJ SOLN
50.0000 ug | Freq: Once | INTRAMUSCULAR | Status: AC
  Administered 2018-11-22: 50 ug via INTRAVENOUS
  Filled 2018-11-22: qty 2

## 2018-11-22 MED ORDER — TAMSULOSIN HCL 0.4 MG PO CAPS: 0.4000 mg | ORAL_CAPSULE | Freq: Every day | ORAL | 0 refills | Status: DC

## 2018-11-22 MED ORDER — OXYCODONE-ACETAMINOPHEN 5-325 MG PO TABS: 1.0000 | ORAL_TABLET | Freq: Four times a day (QID) | ORAL | 0 refills | Status: DC | PRN

## 2018-11-22 NOTE — ED Provider Notes (Signed)
[facility]-EMERGENCY DEPT Provider Note   CSN: [id number] Arrival date & time: 11/22/18  [id number]     History   Chief Complaint Chief Complaint  Patient presents with  . Abdominal Pain    HPI Yvonne Castillo is a 23 y.o. female.  The history is provided by the patient. No language interpreter was used.  Abdominal Pain     Yvonne Castillo is a 23 y.o. female who presents to the Emergency Department complaining of abdominal pain.  She presents to the ED for evaluation of LLQ abdominal pain that began at 4 am.  Pain is severe and constant in nature and radiates to her back.  She has associated nausea.  Denies fevers, vomiting, dysuria, vaginal discharge, vaginal bleeding.  No prior similar sxs.  No prior surgery.  She thinks she may be pregnant.  LMP mid October.  Had positive UPT at home.  She is unsure of how many times she has been pregnant.  She has a hx/o 3-4 abortions.  She is estimated to be a [id number].   Past Medical History:  Diagnosis Date  . Anemia   . Asthma     Patient Active Problem List   Diagnosis Date Noted  . Supervision of high risk pregnancy, antepartum, second trimester 05/11/2018  . Hx of preeclampsia, prior pregnancy, currently pregnant 03/25/2014  . Asthma 03/21/2014  . Nephrolithiasis 02/18/2014    Past Surgical History:  Procedure Laterality Date  . NO PAST SURGERIES    . WISDOM TOOTH EXTRACTION       OB History    Gravida  5   Para  4   Term  3   Preterm  1   AB      Living  3     SAB  0   TAB  0   Ectopic  0   Multiple  0   Live Births  4            Home Medications    Prior to Admission medications   Medication Sig Start Date End Date Taking? Authorizing Provider  albuterol (PROVENTIL HFA;VENTOLIN HFA) 108 (90 Base) MCG/ACT inhaler Inhale 1-2 puffs into the lungs every 6 (six) hours as needed for wheezing or shortness of breath. Patient not taking: Reported on 05/11/2018 04/27/18   Donette LarryBhambri, Melanie, CNM    aspirin EC 81 MG tablet Take 1 tablet (81 mg total) by mouth daily. Take after 12 weeks for prevention of preeclampsia later in pregnancy 05/11/18   Hermina StaggersErvin, Michael L, MD  oxyCODONE-acetaminophen (PERCOCET/ROXICET) 5-325 MG tablet Take 1 tablet by mouth every 6 (six) hours as needed for severe pain. 11/22/18   Tilden Fossaees, Ahrianna Siglin, MD  Prenatal Vit-Fe Fumarate-FA (PREPLUS) 27-1 MG TABS Take 1 tablet by mouth daily. 05/11/18   Hermina StaggersErvin, Michael L, MD  tamsulosin (FLOMAX) 0.4 MG CAPS capsule Take 1 capsule (0.4 mg total) by mouth daily. 11/22/18   Tilden Fossaees, Aliea Bobe, MD    Family History Family History  Problem Relation Age of Onset  . Anemia Mother   . Depression Mother   . Asthma Brother   . Diabetes Maternal Grandmother   . Heart disease Paternal Grandfather     Social History Social History   Tobacco Use  . Smoking status: Current Some Day Smoker    Packs/day: 0.25    Types: Cigars, Cigarettes  . Smokeless tobacco: Never Used  Substance Use Topics  . Alcohol use: Not Currently    Alcohol/week: 0.0 standard drinks  Comment: Stopped April 2019  . Drug use: No     Allergies   Patient has no known allergies.   Review of Systems Review of Systems  Gastrointestinal: Positive for abdominal pain.  All other systems reviewed and are negative.    Physical Exam Updated Vital Signs BP (!) 97/47 (BP Location: Right Arm)   Pulse 77   Temp 98.4 F (36.9 C)   Resp 18   LMP 01/01/2018   SpO2 100%   Physical Exam Vitals signs and nursing note reviewed.  Constitutional:      General: She is in acute distress.     Appearance: She is well-developed. She is diaphoretic.  HENT:     Head: Normocephalic and atraumatic.  Cardiovascular:     Rate and Rhythm: Normal rate and regular rhythm.     Heart sounds: No murmur.  Pulmonary:     Effort: Pulmonary effort is normal. No respiratory distress.     Breath sounds: Normal breath sounds.  Abdominal:     Palpations: Abdomen is soft.      Tenderness: There is no guarding or rebound.     Comments: Mild generalized abdominal tenderness, greatest over lower abdomen.  gravid uterus to umbilicus.  Genitourinary:    Comments: Mild white vaginal discharge, no CMT Musculoskeletal:        General: No tenderness.  Skin:    General: Skin is warm.  Neurological:     Mental Status: She is alert and oriented to person, place, and time.  Psychiatric:        Behavior: Behavior normal.      ED Treatments / Results  Labs (all labs ordered are listed, but only abnormal results are displayed) Labs Reviewed  WET PREP, GENITAL - Abnormal; Notable for the following components:      Result Value   WBC, Wet Prep HPF POC FEW (*)    All other components within normal limits  COMPREHENSIVE METABOLIC PANEL - Abnormal; Notable for the following components:   Potassium 3.3 (*)    Glucose, Bld 109 (*)    Albumin 3.4 (*)    All other components within normal limits  CBC - Abnormal; Notable for the following components:   WBC 11.6 (*)    All other components within normal limits  URINALYSIS, ROUTINE W REFLEX MICROSCOPIC - Abnormal; Notable for the following components:   APPearance CLOUDY (*)    Hgb urine dipstick LARGE (*)    Protein, ur 30 (*)    RBC / HPF >50 (*)    All other components within normal limits  HCG, QUANTITATIVE, PREGNANCY - Abnormal; Notable for the following components:   hCG, Beta Chain, Quant, S 29,418 (*)    All other components within normal limits  URINE CULTURE  LIPASE, BLOOD  RPR  HIV ANTIBODY (ROUTINE TESTING W REFLEX)  ABO/RH  GC/CHLAMYDIA PROBE AMP ([place]) NOT AT [facility]    EKG None  Radiology US Ob Limited > 14 Wks  Result Date: 11/22/2018 CLINICAL DATA:  Left lower quadrant pain since 4 a.m. Rule out torsion EXAM: LIMITED OBSTETRIC ULTRASOUND FINDINGS: Number of Fetuses: 1 Heart Rate:  145 bpm Movement: Yes Presentation: Cephalic Placental Location: Anterior Previa: No Amniotic Fluid (Subjective):   Normal BPD: 3.8 cm 17 w  4 d MATERNAL FINDINGS: Cervix:  Appears closed. Uterus/Adnexae: No abnormality visualized. 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 IMPRESSION: 1. Single living intrauterine gestation with an estimated gestational age of [redacted] weeks and 4  days. 2. 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 Therefore, the requested Doppler evaluation of the ovaries could not be performed. This exam is performed on an emergent basis and does not comprehensively evaluate fetal size, dating, or anatomy; follow-up complete OB US should be considered if further fetal assessment is warranted. Electronically Signed   By: Signa Kell M.D.   On: 11/22/2018 11:41   US Renal  Result Date: 11/22/2018 CLINICAL DATA:  Left lower quadrant pain since this morning. EXAM: RENAL / URINARY TRACT ULTRASOUND COMPLETE COMPARISON:  CT 01/25/2017 FINDINGS: Right Kidney: Renal measurements: 5.7 x 5.1 x 12.0 cm = volume: 183 mL . Echogenicity within normal limits. No mass or hydronephrosis visualized. No definite stones visualized. Left Kidney: Renal measurements: 7.1 x 6.0 x 13.3 cm = volume: 294 mL. Echogenicity within normal limits. No mass or hydronephrosis visualized. No definite stones visualized. Bladder: Appears normal for degree of bladder distention. Evidence of known gravid uterus. IMPRESSION: Normal size kidneys without hydronephrosis. No definite stones visualized. Electronically Signed   By: Elberta Fortis M.D.   On: 11/22/2018 11:24    Procedures Procedures (including critical care time)  Medications Ordered in ED Medications  sodium chloride 0.9 % bolus 1,000 mL (0 mLs Intravenous Stopped 11/22/18 1124)  fentaNYL (SUBLIMAZE) injection 50 mcg (50 mcg Intravenous Given 11/22/18 1025)     Initial Impression / Assessment and Plan / ED Course  I have reviewed the triage vital signs and the nursing notes.  Pertinent labs & imaging results that were available during my care of the  patient were reviewed by me and considered in my medical decision making (see chart for details).     Patient here for evaluation of left lower quadrant pain. Patient uncomfortable appearing on initial assessment with diaphoresis and significant tenderness. Ultrasound demonstrates IUP that is estimated be 17 weeks and four days, no visible ovarian cyst. UA with hematuria. Renal ultrasound demonstrates no hydronephrosis. UA is not consistent with UTI. Following treatment with pain medications and fluids patient is feeling improved and pain is resolved. Patient with likely renal colic. Plan to discharge home with close OB/GYN as well as urology follow-up. Discussed with patient home care as well as outpatient follow-up and return precautions.  Final Clinical Impressions(s) / ED Diagnoses   Final diagnoses:  Ureteral colic  [redacted] weeks gestation of pregnancy  Left lower quadrant abdominal pain    ED Discharge Orders         Ordered    tamsulosin (FLOMAX) 0.4 MG CAPS capsule  Daily     11/22/18 1542    oxyCODONE-acetaminophen (PERCOCET/ROXICET) 5-325 MG tablet  Every 6 hours PRN     11/22/18 1542           Tilden Fossa, MD 11/22/18 1611

## 2018-11-22 NOTE — Discharge Instructions (Signed)
You had an ultrasound today that showed a pregnancy that is 17 weeks and 4 days today.  You have a possible kidney stone that is causing your pain.  Please call to get close follow up with OB as well as Urology for recheck.  Get rechecked immediately if you develop fevers, bleeding, uncontrolled pain or new concerning symptoms.

## 2018-11-22 NOTE — ED Triage Notes (Signed)
Per EMS: Pt is coming from home with complaints of abdominal pain. Pt reports she is also pregnant but unsure how far along she is and unsure when her last period was.  Pt reports the pain started around 2 hours ago. Pt denies bleeding and reports stabbing pain and tenderness on the left side. No trauma.

## 2018-11-23 LAB — HIV ANTIBODY (ROUTINE TESTING W REFLEX): HIV Screen 4th Generation wRfx: NONREACTIVE

## 2018-11-23 LAB — GC/CHLAMYDIA PROBE AMP (~~LOC~~) NOT AT ARMC
Chlamydia: NEGATIVE
NEISSERIA GONORRHEA: NEGATIVE

## 2018-11-23 LAB — RPR: RPR Ser Ql: NONREACTIVE

## 2018-11-24 LAB — URINE CULTURE: CULTURE: NO GROWTH

## 2018-12-19 ENCOUNTER — Inpatient Hospital Stay (HOSPITAL_COMMUNITY)
Admission: AD | Admit: 2018-12-19 | Discharge: 2018-12-19 | Disposition: A | Discharge status: Home / Self Care | Payer: Self-pay | Source: Ambulatory Visit | Attending: Obstetrics and Gynecology | Admitting: Obstetrics and Gynecology

## 2018-12-19 ENCOUNTER — Encounter (HOSPITAL_COMMUNITY): Payer: Self-pay

## 2018-12-19 ENCOUNTER — Other Ambulatory Visit: Payer: Self-pay

## 2018-12-19 DIAGNOSIS — B9689 Other specified bacterial agents as the cause of diseases classified elsewhere: Secondary | ICD-10-CM

## 2018-12-19 DIAGNOSIS — Z3A21 21 weeks gestation of pregnancy: Secondary | ICD-10-CM | POA: Insufficient documentation

## 2018-12-19 DIAGNOSIS — O23599 Infection of other part of genital tract in pregnancy, unspecified trimester: Secondary | ICD-10-CM

## 2018-12-19 DIAGNOSIS — O99332 Smoking (tobacco) complicating pregnancy, second trimester: Secondary | ICD-10-CM | POA: Insufficient documentation

## 2018-12-19 DIAGNOSIS — F17290 Nicotine dependence, other tobacco product, uncomplicated: Secondary | ICD-10-CM | POA: Insufficient documentation

## 2018-12-19 DIAGNOSIS — O23592 Infection of other part of genital tract in pregnancy, second trimester: Secondary | ICD-10-CM

## 2018-12-19 DIAGNOSIS — O26892 Other specified pregnancy related conditions, second trimester: Secondary | ICD-10-CM | POA: Insufficient documentation

## 2018-12-19 DIAGNOSIS — F17210 Nicotine dependence, cigarettes, uncomplicated: Secondary | ICD-10-CM | POA: Insufficient documentation

## 2018-12-19 DIAGNOSIS — R109 Unspecified abdominal pain: Secondary | ICD-10-CM | POA: Insufficient documentation

## 2018-12-19 LAB — WET PREP, GENITAL
Sperm: NONE SEEN
Trich, Wet Prep: NONE SEEN
Yeast Wet Prep HPF POC: NONE SEEN

## 2018-12-19 LAB — URINALYSIS, MICROSCOPIC (REFLEX)
Bacteria, UA: NONE SEEN
WBC, UA: NONE SEEN WBC/hpf (ref 0–5)

## 2018-12-19 LAB — URINALYSIS, ROUTINE W REFLEX MICROSCOPIC
BILIRUBIN URINE: NEGATIVE
Glucose, UA: NEGATIVE mg/dL
Ketones, ur: NEGATIVE mg/dL
Leukocytes, UA: NEGATIVE
Nitrite: NEGATIVE
PH: 7 (ref 5.0–8.0)
Protein, ur: NEGATIVE mg/dL
Specific Gravity, Urine: 1.01 (ref 1.005–1.030)

## 2018-12-19 MED ORDER — METRONIDAZOLE 500 MG PO TABS
500.0000 mg | ORAL_TABLET | Freq: Once | ORAL | Status: AC
  Administered 2018-12-19: 500 mg via ORAL
  Filled 2018-12-19: qty 1

## 2018-12-19 MED ORDER — ACETAMINOPHEN 500 MG PO TABS
1000.0000 mg | ORAL_TABLET | Freq: Once | ORAL | Status: AC
  Administered 2018-12-19: 1000 mg via ORAL
  Filled 2018-12-19: qty 2

## 2018-12-19 MED ORDER — METRONIDAZOLE 500 MG PO TABS: 500.0000 mg | ORAL_TABLET | Freq: Two times a day (BID) | ORAL | 0 refills | Status: DC

## 2018-12-19 NOTE — MAU Provider Note (Signed)
History     CSN: [id number]  Arrival date and time: 12/19/18 1307   First Provider Initiated Contact with Patient 12/19/18 1357      Chief Complaint  Patient presents with  . Abdominal Pain   Yvonne Castillo is a 23 y.o. [id number] at [redacted]w[redacted]d who presents for Abdominal Pain.  She reports the pain started Monday night and has been a constant sharp pain that "shoots down" into the pelvic area.  Patient presents today for worsening of pain and states it is a 9/10.  She states she has not taken anything for the pain and denies issues with urination, constipation, diarrhea, or n/v.  Patient further denies vaginal concerns including discharge, itching, burning, bleeding, or odor. Patient endorses fetal movement and denies contractions, but reports cramping earlier.  She reports IC ~ 2 days ago with a temporary improvement in her pain.      OB History    Gravida  6   Para  4   Term  3   Preterm  1   AB      Living  3     SAB  0   TAB  0   Ectopic  0   Multiple  0   Live Births  4           Past Medical History:  Diagnosis Date  . Anemia   . Asthma     Past Surgical History:  Procedure Laterality Date  . NO PAST SURGERIES    . WISDOM TOOTH EXTRACTION      Family History  Problem Relation Age of Onset  . Anemia Mother   . Depression Mother   . Asthma Brother   . Diabetes Maternal Grandmother   . Heart disease Paternal Grandfather     Social History   Tobacco Use  . Smoking status: Current Some Day Smoker    Packs/day: 0.25    Types: Cigars, Cigarettes  . Smokeless tobacco: Never Used  Substance Use Topics  . Alcohol use: Not Currently    Alcohol/week: 0.0 standard drinks    Comment: Stopped April 2019  . Drug use: No    Allergies: No Known Allergies  Medications Prior to Admission  Medication Sig Dispense Refill Last Dose  . albuterol (PROVENTIL HFA;VENTOLIN HFA) 108 (90 Base) MCG/ACT inhaler Inhale 1-2 puffs into the lungs every 6 (six) hours as  needed for wheezing or shortness of breath. (Patient not taking: Reported on 05/11/2018) 1 Inhaler 1 Not Taking  . aspirin EC 81 MG tablet Take 1 tablet (81 mg total) by mouth daily. Take after 12 weeks for prevention of preeclampsia later in pregnancy 300 tablet 2   . oxyCODONE-acetaminophen (PERCOCET/ROXICET) 5-325 MG tablet Take 1 tablet by mouth every 6 (six) hours as needed for severe pain. 12 tablet 0   . Prenatal Vit-Fe Fumarate-FA (PREPLUS) 27-1 MG TABS Take 1 tablet by mouth daily. 30 tablet 13   . tamsulosin (FLOMAX) 0.4 MG CAPS capsule Take 1 capsule (0.4 mg total) by mouth daily. 15 capsule 0     Review of Systems  Constitutional: Negative for chills and fever.  Gastrointestinal: Positive for abdominal pain. Negative for constipation, diarrhea, nausea and vomiting.  Genitourinary: Negative for dyspareunia, dysuria, vaginal bleeding, vaginal discharge and vaginal pain.  Neurological: Negative for dizziness, light-headedness and headaches.   Physical Exam   Blood pressure 114/68, pulse 91, temperature 98.5 F (36.9 C), temperature source Oral, resp. rate 20, height 5\' 3"  (1.6 m), SpO2 98 %,  unknown if currently breastfeeding.  Physical Exam  Constitutional: She is oriented to person, place, and time. She appears well-developed and well-nourished. No distress.  HENT:  Head: Normocephalic and atraumatic.  Eyes: Conjunctivae are normal.  Neck: Normal range of motion.  Cardiovascular: Normal rate, regular rhythm and normal heart sounds.  Respiratory: Effort normal and breath sounds normal.  GI: Soft. Bowel sounds are normal. There is abdominal tenderness.  Genitourinary: Cervix exhibits no motion tenderness, no discharge and no friability.    Vaginal discharge present.     No vaginal bleeding.  No bleeding in the vagina.    Genitourinary Comments: Sterile Speculum Exam: -Vaginal Vault: Pink mucosa, Copious amt thin white frothy discharge in vault -wet prep collected -Cervix:Pink,  no lesions, cysts, or polyps.  Appears closed. No active bleeding from os-GC/CT collected -Bimanual Exam: Closed/Long/Thick Fundus is non-tender and palpated at umbilicus. +Mild tenderness in cul de sac.   Musculoskeletal: Normal range of motion.  Neurological: She is alert and oriented to person, place, and time.  Skin: Skin is warm and dry.  Psychiatric: She has a normal mood and affect. Her behavior is normal.    MAU Course  Procedures Results for orders placed or performed during the hospital encounter of 12/19/18 (from the past 24 hour(s))  Urinalysis, Routine w reflex microscopic     Status: Abnormal   Collection Time: 12/19/18  1:45 PM  Result Value Ref Range   Color, Urine YELLOW YELLOW   APPearance CLEAR CLEAR   Specific Gravity, Urine 1.010 1.005 - 1.030   pH 7.0 5.0 - 8.0   Glucose, UA NEGATIVE NEGATIVE mg/dL   Hgb urine dipstick SMALL (A) NEGATIVE   Bilirubin Urine NEGATIVE NEGATIVE   Ketones, ur NEGATIVE NEGATIVE mg/dL   Protein, ur NEGATIVE NEGATIVE mg/dL   Nitrite NEGATIVE NEGATIVE   Leukocytes, UA NEGATIVE NEGATIVE  Urinalysis, Microscopic (reflex)     Status: None   Collection Time: 12/19/18  1:45 PM  Result Value Ref Range   RBC / HPF 0-5 0 - 5 RBC/hpf   WBC, UA NONE SEEN 0 - 5 WBC/hpf   Bacteria, UA NONE SEEN NONE SEEN   Squamous Epithelial / LPF 0-5 0 - 5    MDM Pelvic Exam with cultures; Wet prep and GC/CT Labs: UA   Assessment and Plan  21.3wks IUP Abdominal Pain  -Exam findings discussed -Wet prep pending -GC/CT collected and sent -UA returns with small hgb, but otherwise insignificant findings. -Tylenol XR offered and accepted. -Will await results  Follow Up (2:55 PM) Bacterial Vaginosis  -Wet prep returns significant for clue cells. -Results discussed with patient. -Initial dose of Flagyl given.  -Rx for Metronidazole 500mg  Disp 13, RF 0 sent to pharmacy on file. -Strongly encouraged to initiate prenatal care.  -No  questions/concerns -Preterm labor precautions given. -Encouraged to call or return to MAU if symptoms worsen or with the onset of new symptoms. -Discharged to home in stable condition.  Cherre Robins MSN, CNM 12/19/2018, 1:57 PM

## 2018-12-19 NOTE — MAU Note (Signed)
Pt states she has been having lower abd pain for the past week. Pt states pain is sharp and radiates to pelvis.   Pt states pain is worse today.  Pt rates pain 9/10.  Pt. Denies LOF, vag bleeding or DC.  Pt also reports some increased urinary frequency and burning during urination.

## 2018-12-19 NOTE — Discharge Instructions (Signed)
[facility] ° ° °[facility]       Phone: [phone] ° °[facility] Phone: [phone] ° °[facility]  Phone: [phone] ° °[facility] [place] [facility]  Phone: [phone] ° °[facility]  Phone: [phone]  ° °[facility]       Phone: [phone] ° °[facility]    Phone: [phone]  ° °[facility] ([place]    Phone: [phone] ° °[facility]    Phone: [phone] ° °[facility]    Phone: [phone]  ° °[facility]  Phone: [phone] ° °[facility]    Phone: [phone] ° °[facility]   Phone: [phone] ° °[facility]    Phone: [phone] ° ° ° °Bacterial Vaginosis ° °Bacterial vaginosis is a vaginal infection that occurs when the normal balance of bacteria in the vagina is disrupted. It results from an overgrowth of certain bacteria. This is the most common vaginal infection among women ages 15-44. °Because bacterial vaginosis increases your risk for STIs (sexually transmitted infections), getting treated can help reduce your risk for chlamydia, gonorrhea, herpes, and HIV (human immunodeficiency virus). Treatment is also important for preventing complications in pregnant women, because this condition can cause an early (premature) delivery. °What are the causes? °This condition is caused by an increase in harmful bacteria that are normally present in small amounts in the vagina. However, the reason that the condition develops is not fully understood. °What increases the risk? °The following factors may make you more likely to develop this condition: °· Having a new sexual partner or multiple sexual partners. °· Having  unprotected sex. °· Douching. °· Having an intrauterine device (IUD). °· Smoking. °· Drug and alcohol abuse. °· Taking certain antibiotic medicines. °· Being pregnant. °You cannot get bacterial vaginosis from toilet seats, bedding, swimming pools, or contact with objects around you. °What are the signs or symptoms? °Symptoms of this condition include: °· Grey or white vaginal discharge. The discharge can also be watery or foamy. °· A fish-like odor with discharge, especially after sexual intercourse or during menstruation. °· Itching in and around the vagina. °· Burning or pain with urination. °Some women with bacterial vaginosis have no signs or symptoms. °How is this diagnosed? °This condition is diagnosed based on: °· Your medical history. °· A physical exam of the vagina. °· Testing a sample of vaginal fluid under a microscope to look for a large amount of bad bacteria or abnormal cells. Your health care provider may use a cotton swab or a small wooden spatula to collect the sample. °How is this treated? °This condition is treated with antibiotics. These may be given as a pill, a vaginal cream, or a medicine that is put into the vagina (suppository). If the condition comes back after treatment, a second round of antibiotics may be needed. °Follow these instructions at home: °Medicines °· Take over-the-counter and prescription medicines only as told by your health care provider. °· Take or use your antibiotic as told by your health care provider. Do not stop taking or using the antibiotic even if you start to feel better. °General instructions °· If you have a female sexual partner, tell her that you have a vaginal infection. She should see her health care provider and be treated if she has symptoms.   If you have a female sexual partner, he does not need treatment. °· During treatment: °? Avoid sexual activity until you finish treatment. °? Do not douche. °? Avoid alcohol as directed by your health care  provider. °? Avoid breastfeeding as directed by your health care provider. °· Drink enough water and fluids to keep your urine clear or pale yellow. °· Keep the area around your vagina and rectum clean. °? Wash the area daily with warm water. °? Wipe yourself from front to back after using the toilet. °· Keep all follow-up visits as told by your health care provider. This is important. °How is this prevented? °· Do not douche. °· Wash the outside of your vagina with warm water only. °· Use protection when having sex. This includes latex condoms and dental dams. °· Limit how many sexual partners you have. To help prevent bacterial vaginosis, it is best to have sex with just one partner (monogamous). °· Make sure you and your sexual partner are tested for STIs. °· Wear cotton or cotton-lined underwear. °· Avoid wearing tight pants and pantyhose, especially during summer. °· Limit the amount of alcohol that you drink. °· Do not use any products that contain nicotine or tobacco, such as cigarettes and e-cigarettes. If you need help quitting, ask your health care provider. °· Do not use illegal drugs. °Where to find more information °· Centers for Disease Control and Prevention: www.cdc.gov/std °· American Sexual Health Association (ASHA): www.ashastd.org °· U.S. Department of Health and Human Services, Office on Women's Health: www.womenshealth.gov/ or https://www.womenshealth.gov/a-z-topics/bacterial-vaginosis °Contact a health care provider if: °· Your symptoms do not improve, even after treatment. °· You have more discharge or pain when urinating. °· You have a fever. °· You have pain in your abdomen. °· You have pain during sex. °· You have vaginal bleeding between periods. °Summary °· Bacterial vaginosis is a vaginal infection that occurs when the normal balance of bacteria in the vagina is disrupted. °· Because bacterial vaginosis increases your risk for STIs (sexually transmitted infections), getting treated can  help reduce your risk for chlamydia, gonorrhea, herpes, and HIV (human immunodeficiency virus). Treatment is also important for preventing complications in pregnant women, because the condition can cause an early (premature) delivery. °· This condition is treated with antibiotic medicines. These may be given as a pill, a vaginal cream, or a medicine that is put into the vagina (suppository). °This information is not intended to replace advice given to you by your health care provider. Make sure you discuss any questions you have with your health care provider. °Document Released: 11/04/2005 Document Revised: 03/10/2017 Document Reviewed: 07/20/2016 °Elsevier Interactive Patient Education © 2019 [employer] ° °

## 2018-12-21 LAB — GC/CHLAMYDIA PROBE AMP (~~LOC~~) NOT AT ARMC
Chlamydia: NEGATIVE
Neisseria Gonorrhea: NEGATIVE

## 2019-07-21 ENCOUNTER — Encounter (HOSPITAL_COMMUNITY): Payer: Self-pay

## 2019-12-09 ENCOUNTER — Encounter (HOSPITAL_COMMUNITY): Payer: Self-pay | Admitting: Emergency Medicine

## 2019-12-09 ENCOUNTER — Other Ambulatory Visit: Payer: Self-pay

## 2019-12-09 ENCOUNTER — Ambulatory Visit (HOSPITAL_COMMUNITY): Payer: BC Managed Care – PPO

## 2019-12-09 ENCOUNTER — Emergency Department (HOSPITAL_COMMUNITY)
Admission: EM | Admit: 2019-12-09 | Discharge: 2019-12-09 | Disposition: A | Discharge status: Home / Self Care | Payer: BC Managed Care – PPO | Attending: Emergency Medicine | Admitting: Emergency Medicine

## 2019-12-09 DIAGNOSIS — R110 Nausea: Secondary | ICD-10-CM | POA: Insufficient documentation

## 2019-12-09 DIAGNOSIS — N200 Calculus of kidney: Secondary | ICD-10-CM | POA: Diagnosis not present

## 2019-12-09 DIAGNOSIS — Z720 Tobacco use: Secondary | ICD-10-CM | POA: Diagnosis not present

## 2019-12-09 DIAGNOSIS — Z87442 Personal history of urinary calculi: Secondary | ICD-10-CM | POA: Insufficient documentation

## 2019-12-09 DIAGNOSIS — N134 Hydroureter: Secondary | ICD-10-CM | POA: Diagnosis not present

## 2019-12-09 DIAGNOSIS — N939 Abnormal uterine and vaginal bleeding, unspecified: Secondary | ICD-10-CM

## 2019-12-09 DIAGNOSIS — R1032 Left lower quadrant pain: Secondary | ICD-10-CM | POA: Diagnosis present

## 2019-12-09 LAB — CBC
HCT: 38 % (ref 36.0–46.0)
Hemoglobin: 12.5 g/dL (ref 12.0–15.0)
MCH: 31 pg (ref 26.0–34.0)
MCHC: 32.9 g/dL (ref 30.0–36.0)
MCV: 94.3 fL (ref 80.0–100.0)
Platelets: 247 10*3/uL (ref 150–400)
RBC: 4.03 MIL/uL (ref 3.87–5.11)
RDW: 14 % (ref 11.5–15.5)
WBC: 18.4 10*3/uL — ABNORMAL HIGH (ref 4.0–10.5)
nRBC: 0 % (ref 0.0–0.2)

## 2019-12-09 LAB — URINALYSIS, ROUTINE W REFLEX MICROSCOPIC
Bacteria, UA: NONE SEEN
Bilirubin Urine: NEGATIVE
Glucose, UA: NEGATIVE mg/dL
Ketones, ur: NEGATIVE mg/dL
Leukocytes,Ua: NEGATIVE
Nitrite: NEGATIVE
Protein, ur: NEGATIVE mg/dL
RBC / HPF: >50 RBC/hpf — ABNORMAL HIGH (ref 0–5)
Specific Gravity, Urine: 1.016 (ref 1.005–1.030)
pH: 6 (ref 5.0–8.0)

## 2019-12-09 LAB — COMPREHENSIVE METABOLIC PANEL
ALT: 15 U/L (ref 0–44)
AST: 18 U/L (ref 15–41)
Albumin: 3.6 g/dL (ref 3.5–5.0)
Alkaline Phosphatase: 64 U/L (ref 38–126)
Anion gap: 11 (ref 5–15)
BUN: 9 mg/dL (ref 6–20)
CO2: 24 mmol/L (ref 22–32)
Calcium: 8.8 mg/dL — ABNORMAL LOW (ref 8.9–10.3)
Chloride: 108 mmol/L (ref 98–111)
Creatinine, Ser: 0.81 mg/dL (ref 0.44–1.00)
GFR calc Af Amer: >60 mL/min (ref >60)
GFR calc non Af Amer: >60 mL/min (ref >60)
Glucose, Bld: 103 mg/dL — ABNORMAL HIGH (ref 70–99)
Potassium: 3.4 mmol/L — ABNORMAL LOW (ref 3.5–5.1)
Sodium: 143 mmol/L (ref 135–145)
Total Bilirubin: 0.2 mg/dL — ABNORMAL LOW (ref 0.3–1.2)
Total Protein: 6.1 g/dL — ABNORMAL LOW (ref 6.5–8.1)

## 2019-12-09 LAB — I-STAT BETA HCG BLOOD, ED (MC, WL, AP ONLY): I-stat hCG, quantitative: 62.7 m[IU]/mL — ABNORMAL HIGH (ref <5)

## 2019-12-09 LAB — LIPASE, BLOOD: Lipase: 18 U/L (ref 11–51)

## 2019-12-09 LAB — HCG, QUANTITATIVE, PREGNANCY: hCG, Beta Chain, Quant, S: 74 m[IU]/mL — ABNORMAL HIGH (ref <5)

## 2019-12-09 MED ORDER — ONDANSETRON HCL 4 MG/2ML IJ SOLN
4.0000 mg | Freq: Once | INTRAMUSCULAR | Status: AC
  Administered 2019-12-09: 09:00:00 4 mg via INTRAVENOUS
  Filled 2019-12-09: qty 2

## 2019-12-09 MED ORDER — TAMSULOSIN HCL 0.4 MG PO CAPS: 0.4000 mg | ORAL_CAPSULE | Freq: Every day | ORAL | 0 refills | Status: AC

## 2019-12-09 MED ORDER — ETODOLAC 200 MG PO CAPS: 200.0000 mg | ORAL_CAPSULE | Freq: Three times a day (TID) | ORAL | 0 refills | Status: AC

## 2019-12-09 MED ORDER — ONDANSETRON HCL 4 MG PO TABS: 4.0000 mg | ORAL_TABLET | Freq: Four times a day (QID) | ORAL | 0 refills | Status: DC

## 2019-12-09 MED ORDER — SODIUM CHLORIDE 0.9 % IV BOLUS (SEPSIS)
1000.0000 mL | Freq: Once | INTRAVENOUS | Status: AC
  Administered 2019-12-09: 10:00:00 1000 mL via INTRAVENOUS

## 2019-12-09 MED ORDER — KETOROLAC TROMETHAMINE 15 MG/ML IJ SOLN
15.0000 mg | Freq: Once | INTRAMUSCULAR | Status: AC
  Administered 2019-12-09: 09:00:00 15 mg via INTRAVENOUS
  Filled 2019-12-09: qty 1

## 2019-12-09 MED ORDER — HYDROCODONE-ACETAMINOPHEN 5-325 MG PO TABS
1.0000 | ORAL_TABLET | Freq: Once | ORAL | Status: AC
  Administered 2019-12-09: 1 via ORAL
  Filled 2019-12-09: qty 1

## 2019-12-09 NOTE — ED Triage Notes (Signed)
Pt endorses left sided abd, side and flank pain since 4:45 this morning that woke her out of her sleep. Denies N/V/D or urinary complaints.

## 2019-12-09 NOTE — ED Provider Notes (Signed)
[facility] EMERGENCY DEPARTMENT Provider Note   CSN: [id number] Arrival date & time: 12/09/19  2505     History Chief Complaint  Patient presents with  . Abdominal Pain    Latresha Woodrick is a 24 y.o. female.  Patient is a 24 year old female with past medical history of kidney stones, anemia, asthma presenting to the emergency department for left-sided flank pain which woke her suddenly from her sleep at 4:45 AM.  She reports that the pain feels similar to kidney stone pain that she has had in the past.  Reports nausea without vomiting.  Denies any dysuria or hematuria.  Currently on her menstrual cycle.  Denies any fever, chills.        Past Medical History:  Diagnosis Date  . Anemia   . Asthma     Patient Active Problem List   Diagnosis Date Noted  . Supervision of high risk pregnancy, antepartum, second trimester 05/11/2018  . Hx of preeclampsia, prior pregnancy, currently pregnant 03/25/2014  . Asthma 03/21/2014  . Nephrolithiasis 02/18/2014    Past Surgical History:  Procedure Laterality Date  . NO PAST SURGERIES    . WISDOM TOOTH EXTRACTION       OB History    Gravida  12   Para  4   Term  3   Preterm  1   AB  7   Living  3     SAB  2   TAB  5   Ectopic  0   Multiple  0   Live Births  4           Family History  Problem Relation Age of Onset  . Anemia Mother   . Depression Mother   . Asthma Brother   . Diabetes Maternal Grandmother   . Heart disease Paternal Grandfather     Social History   Tobacco Use  . Smoking status: Current Some Day Smoker    Packs/day: 0.25    Types: Cigars, Cigarettes  . Smokeless tobacco: Never Used  Substance Use Topics  . Alcohol use: Not Currently    Alcohol/week: 0.0 standard drinks    Comment: Stopped April 2019  . Drug use: No    Home Medications Prior to Admission medications   Medication Sig Start Date End Date Taking? Authorizing Provider  etodolac (LODINE) 200 MG  capsule Take 1 capsule (200 mg total) by mouth every 8 (eight) hours for 5 days. 12/09/19 12/14/19  Ronnie Doss A, PA-C  ondansetron (ZOFRAN) 4 MG tablet Take 1 tablet (4 mg total) by mouth every 6 (six) hours. 12/09/19   Arlyn Dunning, PA-C  tamsulosin (FLOMAX) 0.4 MG CAPS capsule Take 1 capsule (0.4 mg total) by mouth daily for 5 days. 12/09/19 12/14/19  Arlyn Dunning, PA-C    Allergies    Patient has no known allergies.  Review of Systems   Review of Systems  Constitutional: Positive for appetite change. Negative for chills and fever.  HENT: Negative for sore throat.   Respiratory: Negative for cough.   Gastrointestinal: Positive for abdominal pain and nausea. Negative for diarrhea and vomiting.  Endocrine: Negative for polyuria.  Genitourinary: Positive for flank pain. Negative for decreased urine volume, difficulty urinating, dysuria, menstrual problem, urgency, vaginal bleeding, vaginal discharge and vaginal pain.  Musculoskeletal: Positive for back pain.  Skin: Negative for rash.  Allergic/Immunologic: Negative for immunocompromised state.  Neurological: Negative for dizziness and headaches.    Physical Exam Updated Vital Signs BP Marland Kitchen)  160/87 (BP Location: Left Arm)   Pulse 64   Temp 98.6 F (37 C) (Oral)   Resp 18   Ht 5\' 2"  (1.575 m)   Wt 67.1 kg   LMP 12/06/2019   SpO2 99%   BMI 27.07 kg/m   Physical Exam Vitals and nursing note reviewed.  Constitutional:      General: She is not in acute distress.    Appearance: Normal appearance. She is well-developed. She is not ill-appearing, toxic-appearing or diaphoretic.     Comments: Patient is writhing in pain on the stretcher  HENT:     Head: Normocephalic.  Eyes:     Conjunctiva/sclera: Conjunctivae normal.  Cardiovascular:     Rate and Rhythm: Normal rate and regular rhythm.  Pulmonary:     Effort: Pulmonary effort is normal.  Abdominal:     General: Abdomen is flat. Bowel sounds are normal.     Palpations:  Abdomen is soft.     Tenderness: There is abdominal tenderness in the left lower quadrant. There is left CVA tenderness. There is no right CVA tenderness or guarding. Negative signs include Murphy's sign and McBurney's sign.  Skin:    General: Skin is warm and dry.  Neurological:     Mental Status: She is alert.  Psychiatric:        Mood and Affect: Mood normal.     ED Results / Procedures / Treatments   Labs (all labs ordered are listed, but only abnormal results are displayed) Labs Reviewed  COMPREHENSIVE METABOLIC PANEL - Abnormal; Notable for the following components:      Result Value   Potassium 3.4 (*)    Glucose, Bld 103 (*)    Calcium 8.8 (*)    Total Protein 6.1 (*)    Total Bilirubin 0.2 (*)    All other components within normal limits  CBC - Abnormal; Notable for the following components:   WBC 18.4 (*)    All other components within normal limits  HCG, QUANTITATIVE, PREGNANCY - Abnormal; Notable for the following components:   hCG, Beta Chain, Quant, S 74 (*)    All other components within normal limits  I-STAT BETA HCG BLOOD, ED ([facility], [facility], [facility] ONLY) - Abnormal; Notable for the following components:   I-stat hCG, quantitative 62.7 (*)    All other components within normal limits  LIPASE, BLOOD  URINALYSIS, ROUTINE W REFLEX MICROSCOPIC    EKG None  Radiology US Renal  Result Date: 12/09/2019 CLINICAL DATA:  Left-sided abdominal pain, history of kidney stones EXAM: RENAL / URINARY TRACT ULTRASOUND COMPLETE COMPARISON:  11/22/2018 ultrasound, stone study from 01/25/2017 FINDINGS: Right Kidney: Renal measurements: 11.4 x 5.4 x 5.6 cm. = volume: 180 mL . Echogenicity within normal limits. Extrarenal pelvis is noted. Left Kidney: Renal measurements: 13.6 x 7.9 x 8.0 cm = volume: 454 mL. Mild hydronephrosis is noted. Echogenicity is seen. Bladder: Appears normal for degree of bladder distention. Ureteral jets are not visualized. Other: There is a 2.7 x 2.0 area of  decreased echogenicity identified in the right suprarenal region posterior to the liver. This corresponds to mild prominence of the right adrenal gland on prior exam IMPRESSION: Mild hydronephrosis on the left. Ureteral jets are not visualized bilaterally. Distal ureteral stone cannot be excluded. CT of the abdomen and pelvis is recommended for further evaluation as clinically indicated. Area of decreased echogenicity in the expected region of the right adrenal gland. Slight prominence of the adrenal gland is noted in retrospect on  the prior CT examination but incompletely evaluated. This could also be evaluated on CT of the abdomen although contrast administration would be helpful in this regard. Electronically Signed   By: Alcide Clever M.D.   On: 12/09/2019 11:50   US OB LESS THAN 14 WEEKS WITH OB TRANSVAGINAL  Result Date: 12/09/2019 CLINICAL DATA:  Vaginal bleeding with positive beta HCG. Recent elective abortion. EXAM: OBSTETRIC <14 WK [name] AND TRANSVAGINAL OB US TECHNIQUE: Both transabdominal and transvaginal ultrasound examinations were performed for complete evaluation of the gestation as well as the maternal uterus, adnexal regions, and pelvic cul-de-sac. Transvaginal technique was performed to assess early pregnancy. COMPARISON:  None. FINDINGS: Intrauterine gestational sac: Not visualized Yolk sac:  Not visualized Embryo:  Not visualized Cardiac Activity: Not visualized Subchorionic hemorrhage:  None visualized. Maternal uterus/adnexae: Cervical os is closed. There is a complex fluid collection with septations within the me tree a measuring 2.5 x 1.9 x 2.1 cm. No extrauterine pelvic or adnexal mass seen beyond small apparent corpus luteum on the right. No free pelvic fluid. IMPRESSION: 1. No intrauterine gestation is evident. There is a complex fluid collection within the endometrium measuring 2.5 x 1.9 x 2.1 cm. Question focal hemorrhage following recent abortion versus retained products of conception.  In this regard, gynecologic assessment advised. Beta HCG should be followed to 0 given this circumstance as well. No appreciable extrauterine mass or fluid. Electronically Signed   By: Bretta Bang III M.D.   On: 12/09/2019 12:16    Procedures Procedures (including critical care time)  Medications Ordered in ED Medications  sodium chloride 0.9 % bolus 1,000 mL (0 mLs Intravenous Stopped 12/09/19 1032)  ketorolac (TORADOL) 15 MG/ML injection 15 mg (15 mg Intravenous Given 12/09/19 0926)  ondansetron (ZOFRAN) injection 4 mg (4 mg Intravenous Given 12/09/19 0926)  HYDROcodone-acetaminophen (NORCO/VICODIN) 5-325 MG per tablet 1 tablet (1 tablet Oral Given 12/09/19 1250)    ED Course  I have reviewed the triage vital signs and the nursing notes.  Pertinent labs & imaging results that were available during my care of the patient were reviewed by me and considered in my medical decision making (see chart for details).  Clinical Course as of Dec 09 1315  Thu Dec 09, 2019  1884 Patient presenting with left-sided flank pain consistent with past kidney stones since about 5 this morning. Patient did see women's clinic on the 13th of this month and had oral medications for an elective abortion and is currently having vaginal bleeding as well. On my exam she has left flank tenderness but also has left abdominal tenderness. I-STAT hCG was positive. Will obtain renal ultrasound but also obtain pelvic ultrasound to rule out any ectopic pregnancy. Patient was given IV fluids and IV Toradol and Zofran.   [KM]  1245 Patient renal ultrasound is consistent with probable left stone.  Also showing abnormal findings on pelvic ultrasound with a question of either retained products versus a hemorrhage in the uterus.  This was discussed with the patient and we are currently awaiting OB/GYN consult.  Patient reports her bleeding has been light and she is overall stable.  Patient is reporting that she wants to leave  because she needs to go and pick up her daughter.  I discussed with the patient the importance of staying so we can get recommendations from OB/GYN as this could be a life-threatening issue.   [KM]  1314 Patient did stay and I did speak with OB/GYN who advised her it is okay  to simply follow-up in the office in the next few days.  Patient discharged with prescriptions for kidney stone and advised on return precautions   [KM]    Clinical Course User Index [KM] Jeral Pinch   MDM Rules/Calculators/A&P                      Based on review of vitals, medical screening exam, lab work and/or imaging, there does not appear to be an acute, emergent etiology for the patient's symptoms. Counseled pt on good return precautions and encouraged both PCP and ED follow-up as needed.  Prior to discharge, I also discussed incidental imaging findings with patient in detail and advised appropriate, recommended follow-up in detail.  Clinical Impression: 1. Kidney stone   2. Vaginal bleeding     Disposition: Discharge  Prior to providing a prescription for a controlled substance, I independently reviewed the patient's recent prescription history on the [place] Controlled Substance Reporting System. The patient had no recent or regular prescriptions and was deemed appropriate for a brief, less than 3 day prescription of narcotic for acute analgesia.  This note was prepared with assistance of Conservation officer, historic buildings. Occasional wrong-word or sound-a-like substitutions may have occurred due to the inherent limitations of voice recognition software.  Final Clinical Impression(s) / ED Diagnoses Final diagnoses:  Kidney stone    Rx / DC Orders ED Discharge Orders         Ordered    etodolac (LODINE) 200 MG capsule  Every 8 hours     12/09/19 1302    tamsulosin (FLOMAX) 0.4 MG CAPS capsule  Daily     12/09/19 1302    ondansetron (ZOFRAN) 4 MG tablet  Every 6 hours     12/09/19  1302           Jeral Pinch 12/09/19 1412    Pricilla Loveless, MD 12/10/19 [phone]

## 2019-12-09 NOTE — Discharge Instructions (Addendum)
You are seen today for left side pain.  Based on your work-up you probably have a kidney stone.  There were also some abnormal findings on your pelvic ultrasound which indicated that you might have a bleed in your uterus or retained products of conception from your recent pregnancy.    Please make sure you follow-up as soon as possible with your OB/GYN.  Please feel welcome to return to the emergency department at anytime for reevaluation.  We have sent some medication to the pharmacy for your kidney stone.

## 2019-12-09 NOTE — ED Notes (Signed)
Pt dc'd home w/all belongings, a/o x4, ambulatory on dc  

## 2019-12-11 ENCOUNTER — Emergency Department (HOSPITAL_COMMUNITY): Payer: Self-pay

## 2019-12-11 ENCOUNTER — Other Ambulatory Visit: Payer: Self-pay

## 2019-12-11 ENCOUNTER — Emergency Department (HOSPITAL_COMMUNITY)
Admission: EM | Admit: 2019-12-11 | Discharge: 2019-12-11 | Disposition: A | Discharge status: Home / Self Care | Payer: Self-pay | Attending: Emergency Medicine | Admitting: Emergency Medicine

## 2019-12-11 DIAGNOSIS — J45909 Unspecified asthma, uncomplicated: Secondary | ICD-10-CM | POA: Insufficient documentation

## 2019-12-11 DIAGNOSIS — E278 Other specified disorders of adrenal gland: Secondary | ICD-10-CM | POA: Insufficient documentation

## 2019-12-11 DIAGNOSIS — R109 Unspecified abdominal pain: Secondary | ICD-10-CM | POA: Insufficient documentation

## 2019-12-11 DIAGNOSIS — F17210 Nicotine dependence, cigarettes, uncomplicated: Secondary | ICD-10-CM | POA: Insufficient documentation

## 2019-12-11 DIAGNOSIS — N200 Calculus of kidney: Secondary | ICD-10-CM | POA: Insufficient documentation

## 2019-12-11 DIAGNOSIS — Z79899 Other long term (current) drug therapy: Secondary | ICD-10-CM | POA: Insufficient documentation

## 2019-12-11 LAB — URINALYSIS, ROUTINE W REFLEX MICROSCOPIC
Bilirubin Urine: NEGATIVE
Glucose, UA: NEGATIVE mg/dL
Hgb urine dipstick: NEGATIVE
Ketones, ur: NEGATIVE mg/dL
Leukocytes,Ua: NEGATIVE
Nitrite: NEGATIVE
Protein, ur: NEGATIVE mg/dL
Specific Gravity, Urine: 1.014 (ref 1.005–1.030)
pH: 7 (ref 5.0–8.0)

## 2019-12-11 LAB — COMPREHENSIVE METABOLIC PANEL
ALT: 12 U/L (ref 0–44)
AST: 17 U/L (ref 15–41)
Albumin: 3.4 g/dL — ABNORMAL LOW (ref 3.5–5.0)
Alkaline Phosphatase: 65 U/L (ref 38–126)
Anion gap: 9 (ref 5–15)
BUN: 9 mg/dL (ref 6–20)
CO2: 22 mmol/L (ref 22–32)
Calcium: 8.6 mg/dL — ABNORMAL LOW (ref 8.9–10.3)
Chloride: 108 mmol/L (ref 98–111)
Creatinine, Ser: 1.37 mg/dL — ABNORMAL HIGH (ref 0.44–1.00)
GFR calc Af Amer: >60 mL/min (ref >60)
GFR calc non Af Amer: 53 mL/min — ABNORMAL LOW (ref >60)
Glucose, Bld: 108 mg/dL — ABNORMAL HIGH (ref 70–99)
Potassium: 3.4 mmol/L — ABNORMAL LOW (ref 3.5–5.1)
Sodium: 139 mmol/L (ref 135–145)
Total Bilirubin: 0.1 mg/dL — ABNORMAL LOW (ref 0.3–1.2)
Total Protein: 6.1 g/dL — ABNORMAL LOW (ref 6.5–8.1)

## 2019-12-11 LAB — CBC
HCT: 38.2 % (ref 36.0–46.0)
Hemoglobin: 13 g/dL (ref 12.0–15.0)
MCH: 31.4 pg (ref 26.0–34.0)
MCHC: 34 g/dL (ref 30.0–36.0)
MCV: 92.3 fL (ref 80.0–100.0)
Platelets: 253 10*3/uL (ref 150–400)
RBC: 4.14 MIL/uL (ref 3.87–5.11)
RDW: 13.8 % (ref 11.5–15.5)
WBC: 11.3 10*3/uL — ABNORMAL HIGH (ref 4.0–10.5)
nRBC: 0 % (ref 0.0–0.2)

## 2019-12-11 LAB — LIPASE, BLOOD: Lipase: 20 U/L (ref 11–51)

## 2019-12-11 LAB — I-STAT BETA HCG BLOOD, ED (MC, WL, AP ONLY): I-stat hCG, quantitative: 37.3 m[IU]/mL — ABNORMAL HIGH (ref <5)

## 2019-12-11 MED ORDER — HYDROCODONE-ACETAMINOPHEN 5-325 MG PO TABS
2.0000 | ORAL_TABLET | Freq: Once | ORAL | Status: AC
  Administered 2019-12-11: 22:00:00 2 via ORAL
  Filled 2019-12-11: qty 2

## 2019-12-11 MED ORDER — HYDROCODONE-ACETAMINOPHEN 5-325 MG PO TABS: 1.0000 | ORAL_TABLET | Freq: Four times a day (QID) | ORAL | 0 refills | Status: AC | PRN

## 2019-12-11 MED ORDER — SODIUM CHLORIDE 0.9% FLUSH: 3.0000 mL | Freq: Once | INTRAVENOUS | Status: DC

## 2019-12-11 MED ORDER — KETOROLAC TROMETHAMINE 60 MG/2ML IM SOLN: 60.0000 mg | Freq: Once | INTRAMUSCULAR | Status: DC

## 2019-12-11 MED ORDER — ONDANSETRON 4 MG PO TBDP
4.0000 mg | ORAL_TABLET | Freq: Once | ORAL | Status: AC
  Administered 2019-12-11: 4 mg via ORAL
  Filled 2019-12-11: qty 1

## 2019-12-11 NOTE — ED Notes (Signed)
Patient verbalizes understanding of discharge instructions. Opportunity for questioning and answers were provided. Armband removed by staff, pt discharged from ED ambulatory.   

## 2019-12-11 NOTE — ED Notes (Signed)
Sent a urine culture with the urine specimen 

## 2019-12-11 NOTE — ED Provider Notes (Signed)
[facility] EMERGENCY DEPARTMENT Provider Note   CSN: [id number] Arrival date & time: 12/11/19  2002     History Chief Complaint  Patient presents with  . Flank Pain    Yvonne Castillo is a 24 y.o. female.  Patient with history of anemia, asthma, recent abortion by taking oral pill, recent visit to the emergency room for similar presents with left flank pain severe and persistent for the past 2 days.  Feels overall similar to previous kidney stones.  No fevers or chills mild nausea.  No vomiting.  Patient has no vaginal bleeding that has improved since her abortion.  Patient has no central abdominal pain.  Patient has outpatient follow-up.        Past Medical History:  Diagnosis Date  . Anemia   . Asthma     Patient Active Problem List   Diagnosis Date Noted  . Supervision of high risk pregnancy, antepartum, second trimester 05/11/2018  . Hx of preeclampsia, prior pregnancy, currently pregnant 03/25/2014  . Asthma 03/21/2014  . Nephrolithiasis 02/18/2014    Past Surgical History:  Procedure Laterality Date  . NO PAST SURGERIES    . WISDOM TOOTH EXTRACTION       OB History    Gravida  12   Para  4   Term  3   Preterm  1   AB  7   Living  3     SAB  2   TAB  5   Ectopic  0   Multiple  0   Live Births  4           Family History  Problem Relation Age of Onset  . Anemia Mother   . Depression Mother   . Asthma Brother   . Diabetes Maternal Grandmother   . Heart disease Paternal Grandfather     Social History   Tobacco Use  . Smoking status: Current Some Day Smoker    Packs/day: 0.25    Types: Cigars, Cigarettes  . Smokeless tobacco: Never Used  Substance Use Topics  . Alcohol use: Not Currently    Alcohol/week: 0.0 standard drinks    Comment: Stopped April 2019  . Drug use: No    Home Medications Prior to Admission medications   Medication Sig Start Date End Date Taking? Authorizing Provider  etodolac (LODINE)  200 MG capsule Take 1 capsule (200 mg total) by mouth every 8 (eight) hours for 5 days. 12/09/19 12/14/19  Arlyn Dunning, PA-C  HYDROcodone-acetaminophen (NORCO) 5-325 MG tablet Take 1-2 tablets by mouth every 6 (six) hours as needed for up to 5 days. 12/11/19 12/16/19  Blane Ohara, MD  ondansetron (ZOFRAN) 4 MG tablet Take 1 tablet (4 mg total) by mouth every 6 (six) hours. 12/09/19   Arlyn Dunning, PA-C  tamsulosin (FLOMAX) 0.4 MG CAPS capsule Take 1 capsule (0.4 mg total) by mouth daily for 5 days. 12/09/19 12/14/19  Arlyn Dunning, PA-C    Allergies    Patient has no known allergies.  Review of Systems   Review of Systems  Constitutional: Positive for appetite change. Negative for chills and fever.  HENT: Negative for congestion.   Eyes: Negative for visual disturbance.  Respiratory: Negative for shortness of breath.   Cardiovascular: Negative for chest pain.  Gastrointestinal: Negative for abdominal pain and vomiting.  Genitourinary: Positive for flank pain. Negative for dysuria.  Musculoskeletal: Negative for back pain, neck pain and neck stiffness.  Skin: Negative for rash.  Neurological: Negative for light-headedness and headaches.    Physical Exam Updated Vital Signs BP (!) 150/92   Pulse 71   Temp 98.6 F (37 C) (Oral)   Resp 20   Wt 67.1 kg   LMP 12/06/2019   SpO2 92%   BMI 27.06 kg/m   Physical Exam Vitals and nursing note reviewed.  Constitutional:      Appearance: She is well-developed.  HENT:     Head: Normocephalic and atraumatic.  Eyes:     General:        Right eye: No discharge.        Left eye: No discharge.     Conjunctiva/sclera: Conjunctivae normal.  Neck:     Trachea: No tracheal deviation.  Cardiovascular:     Rate and Rhythm: Normal rate and regular rhythm.  Pulmonary:     Effort: Pulmonary effort is normal.     Breath sounds: Normal breath sounds.  Abdominal:     General: There is no distension.     Palpations: Abdomen is soft.      Tenderness: There is abdominal tenderness (L flank). There is no guarding.  Musculoskeletal:     Cervical back: Normal range of motion and neck supple.  Skin:    General: Skin is warm.     Findings: No rash.  Neurological:     Mental Status: She is alert and oriented to person, place, and time.     ED Results / Procedures / Treatments   Labs (all labs ordered are listed, but only abnormal results are displayed) Labs Reviewed  COMPREHENSIVE METABOLIC PANEL - Abnormal; Notable for the following components:      Result Value   Potassium 3.4 (*)    Glucose, Bld 108 (*)    Creatinine, Ser 1.37 (*)    Calcium 8.6 (*)    Total Protein 6.1 (*)    Albumin 3.4 (*)    Total Bilirubin 0.1 (*)    GFR calc non Af Amer 53 (*)    All other components within normal limits  CBC - Abnormal; Notable for the following components:   WBC 11.3 (*)    All other components within normal limits  I-STAT BETA HCG BLOOD, ED ([facility], [facility], [facility] ONLY) - Abnormal; Notable for the following components:   I-stat hCG, quantitative 37.3 (*)    All other components within normal limits  LIPASE, BLOOD  URINALYSIS, ROUTINE W REFLEX MICROSCOPIC    EKG None  Radiology CT Renal Stone Study  Result Date: 12/11/2019 CLINICAL DATA:  Flank pain. EXAM: CT ABDOMEN AND PELVIS WITHOUT CONTRAST TECHNIQUE: Multidetector CT imaging of the abdomen and pelvis was performed following the standard protocol without IV contrast. COMPARISON:  January 25, 2017 FINDINGS: Lower chest: Very mild atelectasis is seen along the posterior aspect of the left lung base. Hepatobiliary: No focal liver abnormality is seen. The gallbladder is contracted and otherwise normal in appearance. There is no evidence of biliary dilatation. Pancreas: Unremarkable. No pancreatic ductal dilatation or surrounding inflammatory changes. Spleen: Normal in size without focal abnormality. Adrenals/Urinary Tract: A 1.8 cm x 1.3 cm low-attenuation right adrenal mass is seen.  The left adrenal gland is normal in appearance. Kidneys are normal in size, without focal lesions. A 6 mm obstructing renal stone is seen within the proximal left ureter with mild to moderate severity left-sided hydronephrosis and hydroureter. A 2 mm nonobstructing renal stone is seen within the upper pole of the left kidney. Bladder is unremarkable. Stomach/Bowel: Stomach is  within normal limits. Appendix appears normal. No evidence of bowel wall thickening, distention, or inflammatory changes. Vascular/Lymphatic: No significant vascular findings are present. No enlarged abdominal or pelvic lymph nodes. Reproductive: Uterus and bilateral adnexa are unremarkable. Other: No abdominal wall hernia or abnormality. No abdominopelvic ascites. Musculoskeletal: No acute or significant osseous findings. IMPRESSION: 1. 6 mm obstructing renal stone within the proximal left ureter with mild to moderate severity left-sided hydronephrosis and hydroureter. 2. 2 mm nonobstructing renal stone within the left kidney. 3. Low-attenuation right adrenal mass which may represent an adrenal adenoma. Electronically Signed   By: Virgina Norfolk M.D.   On: 12/11/2019 22:18    Procedures Procedures (including critical care time)  Medications Ordered in ED Medications  sodium chloride flush (NS) 0.9 % injection 3 mL (has no administration in time range)  HYDROcodone-acetaminophen (NORCO/VICODIN) 5-325 MG per tablet 2 tablet (2 tablets Oral Given 12/11/19 2153)  ondansetron (ZOFRAN-ODT) disintegrating tablet 4 mg (4 mg Oral Given 12/11/19 2153)    ED Course  I have reviewed the triage vital signs and the nursing notes.  Pertinent labs & imaging results that were available during my care of the patient were reviewed by me and considered in my medical decision making (see chart for details).    MDM Rules/Calculators/A&P                      Patient with recent abortion and clinical diagnosis of kidney stone presents with  persistent and worsening left flank pain.  Reviewed recent medical records and ultrasound showing mild hydronephrosis.  Discussed risks and benefits of CT and radiation patient wishes to proceed.  Patient's hCG is 37, downtrending since last blood test.  Renal function mild elevated creatinine at 1.37.  Oral pain meds and Zofran for nausea.  CT scan pending.  Urinalysis reviewed no sign of infection. Lipase normal - no evidence of pancreatitis.   CT scan illustrated 6 mm kidney stone.  Patient will need follow-up for adrenal adenoma as well outpatient. Urinalysis reviewed no sign of infection.  Patient's pain resolved on reassessment.  Patient's not vomiting.  Discussed follow-up with urology and reasons to return   Final Clinical Impression(s) / ED Diagnoses Final diagnoses:  Acute left flank pain  Adrenal mass, right ([place])  Kidney stone on left side    Rx / DC Orders ED Discharge Orders         Ordered    HYDROcodone-acetaminophen (NORCO) 5-325 MG tablet  Every 6 hours PRN     12/11/19 2321           Elnora Morrison, MD 12/11/19 2323

## 2019-12-11 NOTE — Discharge Instructions (Addendum)
You will need further imaging test of your right adrenal gland in the outpatient setting. Follow-up closely with urology call them for appointment on Monday. Return the emergency room for persistent vomiting, uncontrolled pain, fevers or new concerns. Strain urine. For severe pain take norco or vicodin however realize they have the potential for addiction and it can make you sleepy and has tylenol in it.  No operating machinery while taking.

## 2019-12-11 NOTE — ED Triage Notes (Signed)
Pt c/o lt and rt flank pain  For sevceral days  She was seennhere 2 days ago with the same pain but the pain has become more severe  lmp unknown  She had an abortion in the pst 2 weeks

## 2019-12-23 ENCOUNTER — Other Ambulatory Visit (HOSPITAL_COMMUNITY)
Admission: RE | Admit: 2019-12-23 | Discharge: 2019-12-23 | Disposition: A | Discharge status: Home / Self Care | Payer: BC Managed Care – PPO | Source: Ambulatory Visit | Attending: Urology | Admitting: Urology

## 2019-12-23 ENCOUNTER — Other Ambulatory Visit: Payer: Self-pay

## 2019-12-23 ENCOUNTER — Ambulatory Visit (INDEPENDENT_AMBULATORY_CARE_PROVIDER_SITE_OTHER): Payer: Self-pay | Admitting: Obstetrics and Gynecology

## 2019-12-23 ENCOUNTER — Other Ambulatory Visit: Payer: Self-pay | Admitting: Urology

## 2019-12-23 DIAGNOSIS — Z20822 Contact with and (suspected) exposure to covid-19: Secondary | ICD-10-CM | POA: Diagnosis not present

## 2019-12-23 DIAGNOSIS — Z3490 Encounter for supervision of normal pregnancy, unspecified, unspecified trimester: Secondary | ICD-10-CM

## 2019-12-23 DIAGNOSIS — N200 Calculus of kidney: Secondary | ICD-10-CM

## 2019-12-23 DIAGNOSIS — Z01812 Encounter for preprocedural laboratory examination: Secondary | ICD-10-CM | POA: Diagnosis not present

## 2019-12-23 LAB — SARS CORONAVIRUS 2 (TAT 6-24 HRS): SARS Coronavirus 2: NEGATIVE

## 2019-12-23 NOTE — Progress Notes (Signed)
I connected with  Yvonne Castillo on 12/23/19 at 1021 by telephone and verified that I am speaking with the correct person using two identifiers.   I discussed the limitations, risks, security and privacy concerns of performing an evaluation and management service by telephone and the availability of in person appointments. I also discussed with the patient that there may be a patient responsible charge related to this service. The patient expressed understanding and agreed to proceed.  [name] states she would not like to keep this appointment because she does not believe it is needed. States she is seeing a urologist for kidney stones. Denies any vaginal bleeding or other issues rt recent abortion. Would not like to speak with provider today. Encouraged pt to follow up with our office as needed.  Marjo Bicker, RN 12/23/2019  10:21 AM

## 2019-12-23 NOTE — Progress Notes (Signed)
Pt declined appt today.   Baldemar Lenis, M.D. Attending [facility] [employer] [facility])

## 2019-12-27 ENCOUNTER — Ambulatory Visit (HOSPITAL_BASED_OUTPATIENT_CLINIC_OR_DEPARTMENT_OTHER): Admission: RE | Admit: 2019-12-27 | Payer: BC Managed Care – PPO | Source: Home / Self Care | Admitting: Urology

## 2019-12-27 SURGERY — LITHOTRIPSY, ESWL
Anesthesia: LOCAL | Laterality: Left

## 2019-12-27 NOTE — Progress Notes (Signed)
Patient passed stone (11/25/19) . Surgery cancelled

## 2020-04-05 ENCOUNTER — Encounter: Payer: Self-pay | Admitting: Obstetrics and Gynecology

## 2020-04-20 ENCOUNTER — Emergency Department (HOSPITAL_COMMUNITY)
Admission: EM | Admit: 2020-04-20 | Discharge: 2020-04-20 | Disposition: A | Discharge status: Home / Self Care | Payer: BC Managed Care – PPO | Attending: Emergency Medicine | Admitting: Emergency Medicine

## 2020-04-20 ENCOUNTER — Other Ambulatory Visit: Payer: Self-pay

## 2020-04-20 ENCOUNTER — Encounter (HOSPITAL_COMMUNITY): Payer: Self-pay

## 2020-04-20 ENCOUNTER — Emergency Department (HOSPITAL_COMMUNITY): Payer: BC Managed Care – PPO

## 2020-04-20 DIAGNOSIS — Y929 Unspecified place or not applicable: Secondary | ICD-10-CM | POA: Diagnosis not present

## 2020-04-20 DIAGNOSIS — X501XXA Overexertion from prolonged static or awkward postures, initial encounter: Secondary | ICD-10-CM | POA: Insufficient documentation

## 2020-04-20 DIAGNOSIS — F17210 Nicotine dependence, cigarettes, uncomplicated: Secondary | ICD-10-CM | POA: Insufficient documentation

## 2020-04-20 DIAGNOSIS — Y999 Unspecified external cause status: Secondary | ICD-10-CM | POA: Insufficient documentation

## 2020-04-20 DIAGNOSIS — W19XXXA Unspecified fall, initial encounter: Secondary | ICD-10-CM

## 2020-04-20 DIAGNOSIS — Y9389 Activity, other specified: Secondary | ICD-10-CM | POA: Insufficient documentation

## 2020-04-20 DIAGNOSIS — Z79899 Other long term (current) drug therapy: Secondary | ICD-10-CM | POA: Insufficient documentation

## 2020-04-20 DIAGNOSIS — S8261XA Displaced fracture of lateral malleolus of right fibula, initial encounter for closed fracture: Secondary | ICD-10-CM | POA: Diagnosis not present

## 2020-04-20 DIAGNOSIS — S99911A Unspecified injury of right ankle, initial encounter: Secondary | ICD-10-CM | POA: Diagnosis present

## 2020-04-20 MED ORDER — ACETAMINOPHEN 325 MG PO TABS
650.0000 mg | ORAL_TABLET | Freq: Once | ORAL | Status: AC | PRN
  Administered 2020-04-20: 650 mg via ORAL
  Filled 2020-04-20 (×2): qty 2

## 2020-04-20 MED ORDER — HYDROCODONE-ACETAMINOPHEN 5-325 MG PO TABS: 1.0000 | ORAL_TABLET | Freq: Once | ORAL | Status: DC

## 2020-04-20 MED ORDER — IBUPROFEN 400 MG PO TABS
600.0000 mg | ORAL_TABLET | Freq: Once | ORAL | Status: AC
  Administered 2020-04-20: 600 mg via ORAL
  Filled 2020-04-20: qty 1

## 2020-04-20 NOTE — ED Notes (Signed)
Ortho at bedside.

## 2020-04-20 NOTE — Discharge Instructions (Signed)
Use Tylenol every 4 hours 1000 mg for pain in addition to ice every couple hours while awake and elevate. Please avoid ibuprofen and motrin type medications when you are pregnant.  No weightbearing for the next few days and then you can gradually put weight on it as tolerated.  Use crutches for support.

## 2020-04-20 NOTE — ED Provider Notes (Signed)
[facility] Provider Note   CSN: [id number] Arrival date & time: 04/20/20  0757     History Chief Complaint  Patient presents with  . Foot Pain  . Ankle Pain    Yvonne Castillo is a 24 y.o. female.  Patient presents with right ankle and foot pain since playing with her daughter last night and awkwardly stepping wrong.  No other injuries.  Pain with walking and swelling.  No        Past Medical History:  Diagnosis Date  . Anemia   . Asthma     Patient Active Problem List   Diagnosis Date Noted  . Supervision of high risk pregnancy, antepartum, second trimester 05/11/2018  . Hx of preeclampsia, prior pregnancy, currently pregnant 03/25/2014  . Asthma 03/21/2014  . Nephrolithiasis 02/18/2014    Past Surgical History:  Procedure Laterality Date  . NO PAST SURGERIES    . WISDOM TOOTH EXTRACTION       OB History    Gravida  13   Para  4   Term  3   Preterm  1   AB  7   Living  3     SAB  2   TAB  5   Ectopic  0   Multiple  0   Live Births  4           Family History  Problem Relation Age of Onset  . Anemia Mother   . Depression Mother   . Asthma Brother   . Diabetes Maternal Grandmother   . Heart disease Paternal Grandfather     Social History   Tobacco Use  . Smoking status: Current Some Day Smoker    Packs/day: 0.25    Types: Cigars, Cigarettes  . Smokeless tobacco: Never Used  Substance Use Topics  . Alcohol use: Not Currently    Alcohol/week: 0.0 standard drinks    Comment: Stopped April 2019  . Drug use: No    Home Medications Prior to Admission medications   Medication Sig Start Date End Date Taking? Authorizing Provider  HYDROcodone-acetaminophen (NORCO/VICODIN) 5-325 MG tablet Take 1 tablet by mouth every 6 (six) hours as needed for moderate pain.    [provider]  ondansetron (ZOFRAN) 4 MG tablet Take 1 tablet (4 mg total) by mouth every 6 (six) hours. 12/09/19   Alveria Apley, PA-C  tamsulosin (FLOMAX) 0.4 MG CAPS capsule Take 0.4 mg by mouth daily.    [provider]    Allergies    Patient has no known allergies.  Review of Systems   Review of Systems  Physical Exam Updated Vital Signs BP 127/87 (BP Location: Right Arm)   Pulse 88   Temp 99.4 F (37.4 C) (Oral)   Resp 12   Ht 5\' 3"  (1.6 m)   Wt 70.8 kg   LMP 12/06/2019 (Approximate)   SpO2 99%   BMI 27.63 kg/m   Physical Exam Vitals and nursing note reviewed.  Constitutional:      Appearance: She is well-developed.  HENT:     Head: Normocephalic and atraumatic.  Eyes:     General:        Right eye: No discharge.        Left eye: No discharge.     Conjunctiva/sclera: Conjunctivae normal.  Neck:     Trachea: No tracheal deviation.  Cardiovascular:     Rate and Rhythm: Normal rate.  Pulmonary:     Effort:  Pulmonary effort is normal.  Abdominal:     General: There is no distension.  Musculoskeletal:        General: Swelling and tenderness present.     Cervical back: Normal range of motion and neck supple.     Comments: Patient has moderate tenderness and swelling lateral malleolus on the right ankle.  No focal tenderness to the right foot or proximal tibia or fibula.  Compartments soft.  Neurovascularly intact.  Skin:    General: Skin is warm.     Findings: No rash.  Neurological:     Mental Status: She is alert and oriented to person, place, and time.     ED Results / Procedures / Treatments   Labs (all labs ordered are listed, but only abnormal results are displayed) Labs Reviewed - No data to display  EKG None  Radiology DG Ankle Complete Right  Result Date: 04/20/2020 CLINICAL DATA:  Pain following fall EXAM: RIGHT ANKLE - COMPLETE 3+ VIEW COMPARISON:  None. FINDINGS: Frontal, oblique, and lateral views were obtained. There is mild soft tissue swelling. There is a small avulsion arising inferiorly from the lateral malleolus. No other evident fracture.  There is a small joint effusion. No joint space narrowing or erosion. Ankle mortise appears intact. IMPRESSION: Small avulsion arising from the inferior aspect of the lateral malleolus with small joint effusion and soft tissue swelling. There may be superimposed ligamentous injury. No other fracture. No appreciable arthropathy. Ankle mortise appears intact. Electronically Signed   By: Bretta Bang III M.D.   On: 04/20/2020 08:46   DG Foot Complete Right  Result Date: 04/20/2020 CLINICAL DATA:  Pain following injury EXAM: RIGHT FOOT COMPLETE - 3+ VIEW COMPARISON:  None. FINDINGS: Frontal, oblique, and lateral views were obtained. No fracture or dislocation. Joint spaces appear normal. No erosive change. IMPRESSION: No fracture or dislocation.  No evident arthropathy. Electronically Signed   By: Bretta Bang III M.D.   On: 04/20/2020 08:44    Procedures Procedures (including critical care time)  Medications Ordered in ED Medications  acetaminophen (TYLENOL) tablet 650 mg (650 mg Oral Given 04/20/20 0900)  ibuprofen (ADVIL) tablet 600 mg (600 mg Oral Given 04/20/20 0935)    ED Course  I have reviewed the triage vital signs and the nursing notes.  Pertinent labs & imaging results that were available during my care of the patient were reviewed by me and considered in my medical decision making (see chart for details).    MDM Rules/Calculators/A&P                      Patient presents with isolated right ankle pain for mechanical fall.  X-ray reviewed showing avulsion fracture of malleoli.  Ice and pain meds given in the ER.  Discussed with Ortho technician for crutches, ASO splint and supportive care.  Follow-up with sports medicine discussed. Final Clinical Impression(s) / ED Diagnoses Final diagnoses:  Fall  Avulsion fracture of lateral malleolus, right, closed, initial encounter    Rx / DC Orders ED Discharge Orders    None       Blane Ohara, MD 04/20/20 [phone]

## 2020-04-20 NOTE — Progress Notes (Signed)
Orthopedic Tech Progress Note Patient Details:  Yvonne Castillo Oct 07, 1996 [id number]  Ortho Devices Type of Ortho Device: ASO, Crutches Ortho Device/Splint Location: Right Lower Extremity - Ankle Ortho Device/Splint Interventions: Ordered, Application, Adjustment   Post Interventions Patient Tolerated: Well Instructions Provided: Adjustment of device, Care of device, Poper ambulation with device   Yvonne Castillo P Harle Stanford 04/20/2020, 10:15 AM

## 2020-04-20 NOTE — ED Notes (Signed)
Pt/ given an ice pack for her right ankle.

## 2020-04-20 NOTE — ED Triage Notes (Addendum)
Pt reports she was playing with her daughter last night and injured her right foot/ankle. Slight swelling noted. +ROM. Pt also states she is [redacted] weeks pregnant

## 2021-03-07 ENCOUNTER — Encounter (HOSPITAL_COMMUNITY): Payer: Self-pay | Admitting: Obstetrics and Gynecology

## 2021-03-07 ENCOUNTER — Inpatient Hospital Stay (HOSPITAL_COMMUNITY): Payer: BC Managed Care – PPO

## 2021-03-07 ENCOUNTER — Inpatient Hospital Stay (HOSPITAL_COMMUNITY)
Admission: AD | Admit: 2021-03-07 | Discharge: 2021-03-07 | Disposition: A | Discharge status: Home / Self Care | Payer: BC Managed Care – PPO | Attending: Obstetrics and Gynecology | Admitting: Obstetrics and Gynecology

## 2021-03-07 ENCOUNTER — Other Ambulatory Visit: Payer: Self-pay

## 2021-03-07 DIAGNOSIS — Z3A01 Less than 8 weeks gestation of pregnancy: Secondary | ICD-10-CM | POA: Insufficient documentation

## 2021-03-07 DIAGNOSIS — M940 Chondrocostal junction syndrome [Tietze]: Secondary | ICD-10-CM

## 2021-03-07 DIAGNOSIS — O09291 Supervision of pregnancy with other poor reproductive or obstetric history, first trimester: Secondary | ICD-10-CM | POA: Insufficient documentation

## 2021-03-07 DIAGNOSIS — O99331 Smoking (tobacco) complicating pregnancy, first trimester: Secondary | ICD-10-CM | POA: Insufficient documentation

## 2021-03-07 DIAGNOSIS — F17210 Nicotine dependence, cigarettes, uncomplicated: Secondary | ICD-10-CM | POA: Insufficient documentation

## 2021-03-07 DIAGNOSIS — O99891 Other specified diseases and conditions complicating pregnancy: Secondary | ICD-10-CM | POA: Diagnosis not present

## 2021-03-07 DIAGNOSIS — O209 Hemorrhage in early pregnancy, unspecified: Secondary | ICD-10-CM | POA: Insufficient documentation

## 2021-03-07 DIAGNOSIS — R079 Chest pain, unspecified: Secondary | ICD-10-CM | POA: Diagnosis present

## 2021-03-07 HISTORY — DX: Chronic kidney disease, unspecified: N18.9

## 2021-03-07 HISTORY — DX: Anxiety disorder, unspecified: F41.9

## 2021-03-07 LAB — CBC
HCT: 40.1 % (ref 36.0–46.0)
Hemoglobin: 13.3 g/dL (ref 12.0–15.0)
MCH: 30.1 pg (ref 26.0–34.0)
MCHC: 33.2 g/dL (ref 30.0–36.0)
MCV: 90.7 fL (ref 80.0–100.0)
Platelets: 267 10*3/uL (ref 150–400)
RBC: 4.42 MIL/uL (ref 3.87–5.11)
RDW: 13.8 % (ref 11.5–15.5)
WBC: 10.8 10*3/uL — ABNORMAL HIGH (ref 4.0–10.5)
nRBC: 0 % (ref 0.0–0.2)

## 2021-03-07 LAB — ABO/RH: ABO/RH(D): O POS

## 2021-03-07 LAB — WET PREP, GENITAL
Sperm: NONE SEEN
Trich, Wet Prep: NONE SEEN
Yeast Wet Prep HPF POC: NONE SEEN

## 2021-03-07 LAB — URINALYSIS, ROUTINE W REFLEX MICROSCOPIC
Bilirubin Urine: NEGATIVE
Glucose, UA: NEGATIVE mg/dL
Hgb urine dipstick: NEGATIVE
Ketones, ur: NEGATIVE mg/dL
Leukocytes,Ua: NEGATIVE
Nitrite: NEGATIVE
Protein, ur: NEGATIVE mg/dL
Specific Gravity, Urine: 1.015 (ref 1.005–1.030)
pH: 7 (ref 5.0–8.0)

## 2021-03-07 LAB — HCG, QUANTITATIVE, PREGNANCY: hCG, Beta Chain, Quant, S: 70590 m[IU]/mL — ABNORMAL HIGH (ref <5)

## 2021-03-07 LAB — POCT PREGNANCY, URINE: Preg Test, Ur: POSITIVE — AB

## 2021-03-07 NOTE — MAU Note (Signed)
+  HPT 4  Days ago.  Every time she coughs or sneezes, she gets a sharp pain below her left breast, feels like someone is poking a bruise. If she pushes on the area it hurts. No abd pain, no bleeding.  Had a little spotting off and on over the weekend.

## 2021-03-07 NOTE — MAU Provider Note (Signed)
Chief Complaint:  Chest Pain and Possible Pregnancy   Event Date/Time   First Provider Initiated Contact with Patient 03/07/21 1446     HPI: Yvonne Castillo is a 25 y.o. [id number] at [redacted]w[redacted]d who presents to maternity admissions reporting recent positive pregnancy test. She comes in today with reports of rib pain under her right breast only when she coughs, sneezes, or pushes into her rib for the last 2 weeks. She has no pain currently. Reports that it "feels like someone is taking their finger and pushing on a bruise". She has taken Tylenol and Ibuprofen intermittently with minimal relief. She denies any recent sickness, fever, cough, congestion, sore throat, SOB, or chest pain. She also endorses having some light pink vaginal bleeding 3 days ago that lasted 2 days. She saw it on her underwear and when wiping. Never had to wear a pad. She denies abdominal/back/pelvic pain, heavy vaginal bleeding, or vaginal discharge. LMP was 01/13/21.    Pregnancy Course:   Past Medical History:  Diagnosis Date  . Anemia   . Asthma    OB History  Gravida Para Term Preterm AB Living  14 4 3 1 7 3   SAB IAB Ectopic Multiple Live Births  2 5 0 0 4    # Outcome Date GA Lbr Len/2nd Weight Sex Delivery Anes PTL Lv  14 Current           13 Preterm 07/25/15 [redacted]w[redacted]d   M   Y ND  12 Term 05/12/14 [redacted]w[redacted]d 14:40 / 00:04 3300 g F Vag-Spont EPI  LIV  11 Term 05/18/13 [redacted]w[redacted]d  3629 g F Vag-Spont EPI  LIV     Birth Comments: PPH; retained placenta  10 Term 03/13/10 [redacted]w[redacted]d  3515 g F Vag-Spont EPI  LIV     Birth Comments: Postpartume Prex; hospitalized on Mag, elevated liver enzymes, severe range pressures.  9 Gravida           8 SAB           7 SAB           6 IAB           5 IAB           4 IAB           3 IAB           2 Gravida           1 IAB            Past Surgical History:  Procedure Laterality Date  . NO PAST SURGERIES    . WISDOM TOOTH EXTRACTION     Family History  Problem Relation Age of Onset  . Anemia Mother    . Depression Mother   . Asthma Brother   . Diabetes Maternal Grandmother   . Heart disease Paternal Grandfather    Social History   Tobacco Use  . Smoking status: Current Some Day Smoker    Packs/day: 0.25    Types: Cigars, Cigarettes  . Smokeless tobacco: Never Used  Substance Use Topics  . Alcohol use: Not Currently    Alcohol/week: 0.0 standard drinks    Comment: Stopped April 2019  . Drug use: No   No Known Allergies Medications Prior to Admission  Medication Sig Dispense Refill Last Dose  . HYDROcodone-acetaminophen (NORCO/VICODIN) 5-325 MG tablet Take 1 tablet by mouth every 6 (six) hours as needed for moderate pain.     May 2019 ondansetron (ZOFRAN) 4 MG tablet Take  1 tablet (4 mg total) by mouth every 6 (six) hours. 12 tablet 0   . tamsulosin (FLOMAX) 0.4 MG CAPS capsule Take 0.4 mg by mouth daily.       I have reviewed patient's Past Medical Hx, Surgical Hx, Family Hx, Social Hx, medications and allergies.   ROS:  Review of Systems  Constitutional: Negative.   HENT: Negative.   Respiratory: Negative.   Cardiovascular: Negative.   Gastrointestinal: Negative.   Genitourinary: Negative for vaginal discharge.       Vaginal spotting  Musculoskeletal:       Rib pain  Neurological: Negative.   Psychiatric/Behavioral: Negative.     Physical Exam   Patient Vitals for the past 24 hrs:  BP Temp Temp src Pulse Resp SpO2 Height Weight  03/07/21 1426 128/78 98.3 F (36.8 C) Oral 90 20 100 % 5\' 2"  (1.575 m) 77.7 kg  03/07/21 1424 -- -- -- -- -- 100 % -- --   Constitutional: well-developed, well-nourished female in no acute distress.  Cardiovascular: normal rate Respiratory: normal effort GI: abd soft, non-tender MS: extremities nontender, no edema, normal ROM Neurologic: alert and oriented x 4.  GU: Neg CVAT. Pelvic: deferred    Labs: Results for orders placed or performed during the hospital encounter of 03/07/21 (from the past 24 hour(s))  Pregnancy, urine POC      Status: Abnormal   Collection Time: 03/07/21  2:34 PM  Result Value Ref Range   Preg Test, Ur POSITIVE (A) NEGATIVE  Urinalysis, Routine w reflex microscopic Urine, Clean Catch     Status: None   Collection Time: 03/07/21  2:37 PM  Result Value Ref Range   Color, Urine YELLOW YELLOW   APPearance CLEAR CLEAR   Specific Gravity, Urine 1.015 1.005 - 1.030   pH 7.0 5.0 - 8.0   Glucose, UA NEGATIVE NEGATIVE mg/dL   Hgb urine dipstick NEGATIVE NEGATIVE   Bilirubin Urine NEGATIVE NEGATIVE   Ketones, ur NEGATIVE NEGATIVE mg/dL   Protein, ur NEGATIVE NEGATIVE mg/dL   Nitrite NEGATIVE NEGATIVE   Leukocytes,Ua NEGATIVE NEGATIVE  Wet prep, genital     Status: Abnormal   Collection Time: 03/07/21  2:57 PM   Specimen: Vaginal  Result Value Ref Range   Yeast Wet Prep HPF POC NONE SEEN NONE SEEN   Trich, Wet Prep NONE SEEN NONE SEEN   Clue Cells Wet Prep HPF POC PRESENT (A) NONE SEEN   WBC, Wet Prep HPF POC FEW (A) NONE SEEN   Sperm NONE SEEN   CBC     Status: Abnormal   Collection Time: 03/07/21  3:04 PM  Result Value Ref Range   WBC 10.8 (H) 4.0 - 10.5 K/uL   RBC 4.42 3.87 - 5.11 MIL/uL   Hemoglobin 13.3 12.0 - 15.0 g/dL   HCT 03/09/21 16.9 - 67.8 %   MCV 90.7 80.0 - 100.0 fL   MCH 30.1 26.0 - 34.0 pg   MCHC 33.2 30.0 - 36.0 g/dL   RDW 93.8 10.1 - 75.1 %   Platelets 267 150 - 400 K/uL   nRBC 0.0 0.0 - 0.2 %  ABO/Rh     Status: None   Collection Time: 03/07/21  3:04 PM  Result Value Ref Range   ABO/RH(D) O POS    No rh immune globuloin      NOT A RH IMMUNE GLOBULIN CANDIDATE, PT RH POSITIVE Performed at [facility], [address] [place], [place]     Imaging:  US OB LESS THAN 14 WEEKS WITH OB TRANSVAGINAL  Result Date: 03/07/2021 CLINICAL DATA:  Vaginal bleeding EXAM: OBSTETRIC <14 WK [name] AND TRANSVAGINAL OB US TECHNIQUE: Both transabdominal and transvaginal ultrasound examinations were performed for complete evaluation of the gestation as well as the maternal  uterus, adnexal regions, and pelvic cul-de-sac. Transvaginal technique was performed to assess early pregnancy. COMPARISON:  None. FINDINGS: Intrauterine gestational sac: Single Yolk sac:  Visualized Embryo:  Visualized Cardiac Activity: Visualized Heart Rate: 139 bpm MSD:   mm    w     d CRL:  10.8 mm   7 w   1 d                  [name] EDC: 10/23/2021 Subchorionic hemorrhage:  None visualized. Maternal uterus/adnexae: No adnexal mass or free fluid. IMPRESSION: Seven week 1 day intrauterine pregnancy. Fetal heart rate 139 beats per minute. No acute maternal findings. Electronically Signed   By: Charlett Nose M.D.   On: 03/07/2021 17:35    MAU Course: Orders Placed This Encounter  Procedures  . Wet prep, genital  . US OB LESS THAN 14 WEEKS WITH OB TRANSVAGINAL  . Urinalysis, Routine w reflex microscopic Urine, Clean Catch  . CBC  . hCG, quantitative, pregnancy  . Pregnancy, urine POC  . ABO/Rh  . Discharge patient   No orders of the defined types were placed in this encounter.   MDM: CBC HCG ABO/RH, O positive Wet prep GC/CT pending [name] with IUP Rib pain likely musculoskeletal in nature given symptoms. Reassurance provided to pt. Pt given warning signs on when to seek emergency care.    Assessment: 1. [redacted] weeks gestation of pregnancy   2. Vaginal bleeding affecting early pregnancy   3. Costochondritis     Plan: Discharge home in stable condition Follow up in MAU as needed for emergencies Follow up with any OBGYN of choice to start prenatal care    Allergies as of 03/07/2021   No Known Allergies     Medication List    STOP taking these medications   HYDROcodone-acetaminophen 5-325 MG tablet Commonly known as: NORCO/VICODIN   ondansetron 4 MG tablet Commonly known as: ZOFRAN   tamsulosin 0.4 MG Caps capsule Commonly known as: Donnamae Jude, CNM 03/07/2021 2:59 PM

## 2021-03-07 NOTE — Discharge Instructions (Signed)
Costochondritis  Costochondritis is irritation and swelling (inflammation) of the tissue that connects the ribs to the breastbone (sternum). This tissue is called cartilage. Costochondritis causes pain in the front of the chest. Usually, the pain:  Starts slowly.  Is in more than one rib. What are the causes? The exact cause of this condition is not always known. It results from stress on the tissue in the affected area. The cause of this stress could be:  Chest injury.  Exercise or activity, such as lifting.  Very bad coughing. What increases the risk? You are more likely to develop this condition if you:  Are female.  Are 30-40 years old.  Recently started a new exercise or work activity.  Have low levels of vitamin D.  Have a condition that makes you cough often. What are the signs or symptoms? The main symptom of this condition is chest pain. The pain:  Usually starts slowly and can be sharp or dull.  Gets worse with deep breathing, coughing, or exercise.  Gets better with rest.  May be worse when you press on the affected area of your ribs and breastbone. How is this treated? This condition usually goes away on its own over time. Your doctor may prescribe an NSAID, such as ibuprofen. This can help reduce pain and inflammation. Treatment may also include:  Resting and avoiding activities that make pain worse.  Putting heat or ice on the painful area.  Doing exercises to stretch your chest muscles. If these treatments do not help, your doctor may inject a numbing medicine to help relieve the pain. Follow these instructions at home: Managing pain, stiffness, and swelling  If told, put ice on the painful area. To do this: ? Put ice in a plastic bag. ? Place a towel between your skin and the bag. ? Leave the ice on for 20 minutes, 2-3 times a day.  If told, put heat on the affected area. Do this as often as told by your doctor. Use the heat source that your  doctor recommends, such as a moist heat pack or a heating pad. ? Place a towel between your skin and the heat source. ? Leave the heat on for 20-30 minutes. ? Take off the heat if your skin turns bright red. This is very important if you cannot feel pain, heat, or cold. You may have a greater risk of getting burned.      Activity  Rest as told by your doctor.  Do not do anything that makes your pain worse. This includes any activities that use chest, belly (abdomen), and side muscles.  Do not lift anything that is heavier than 10 lb (4.5 kg), or the limit that you are told, until your doctor says that it is safe.  Return to your normal activities as told by your doctor. Ask your doctor what activities are safe for you. General instructions  Take over-the-counter and prescription medicines only as told by your doctor.  Keep all follow-up visits as told by your doctor. This is important. Contact a doctor if:  You have chills or a fever.  Your pain does not go away or it gets worse.  You have a cough that does not go away. Get help right away if:  You are short of breath.  You have very bad chest pain that is not helped by medicines, heat, or ice. These symptoms may be an emergency. Do not wait to see if the symptoms will go away. Get   medical help right away. Call your local emergency services (911 in the [place] Do not drive yourself to the hospital. Summary  Costochondritis is irritation and swelling (inflammation) of the tissue that connects the ribs to the breastbone (sternum).  This condition causes pain in the front of the chest.  Treatment may include medicines, rest, heat or ice, and exercises. This information is not intended to replace advice given to you by your health care provider. Make sure you discuss any questions you have with your health care provider. Document Revised: 09/17/2019 Document Reviewed: 09/17/2019 Elsevier Patient Education  2021 Elsevier  Inc. Vaginal Bleeding During Pregnancy, First Trimester A small amount of bleeding from the vagina, or spotting, is common during early pregnancy. Some bleeding may be related to the pregnancy, and some may not. In many cases, the bleeding is normal and is not a problem. However, bleeding can also be a sign of something serious. Normal things that may cause bleeding during the first trimester:  Implantation of the fertilized egg in the lining of the uterus.  Rapid changes in blood vessels. This is caused by changes that are happening to the body during pregnancy.  Sex.  Pelvic exams. Abnormal things that may cause bleeding during the first trimester include:  Infection or inflammation of the cervix.  Growths or polyps on the cervix.  Miscarriage or threatened miscarriage.  Pregnancy that is growing outside of the uterus (ectopic pregnancy).  A fertilized egg that becomes a mass of tissue (molar pregnancy). Tell your health care provider right away if there is any bleeding from your vagina. Follow these instructions at home: Monitoring your bleeding Monitor your bleeding.  Pay attention to any changes in your symptoms. Let your health care provider know about any concerns.  Try to understand when the bleeding occurs. Does the bleeding start on its own, or does it start after something is done, such as sex or a pelvic exam?  Use a diary to record the things you see about your bleeding, including: ? The kind of bleeding you are having. Does the bleeding start and stop irregularly, or is it a constant flow? ? The severity of your bleeding. Is the bleeding heavy or light? ? The number of pads you use each day, how often you change them, and how soaked they are.  Tell your health care provider if you pass tissue. He or she may want to see it.   Activity  Follow instructions from your health care provider about limiting your activity. Ask what activities are safe for you.  Do not  have sex until your health care provider says that this is safe.  If needed, make plans for someone to help with your regular activities. General instructions  Take over-the-counter and prescription medicines only as told by your health care provider.  Do not take aspirin because it can cause bleeding.  Do not use tampons or douche.  Keep all follow-up visits. This is important. Contact a health care provider if:  You have vaginal bleeding during any part of your pregnancy.  You have cramps or labor pains.  You have a fever or chills. Get help right away if:  You have severe cramps in your back or abdomen.  You pass large clots or a large amount of tissue from your vagina.  Your bleeding increases.  You feel light-headed or weak, or you faint.  You are leaking fluid or have a gush of fluid from your vagina. Summary  A small amount  of bleeding from the vagina is common during early pregnancy.  Be sure to tell your health care provider about any vaginal bleeding right away.  Try to understand when bleeding occurs. Does bleeding occur on its own, or does it occur after something is done, such as sex or pelvic exams?  Keep all follow-up visits. This is important. This information is not intended to replace advice given to you by your health care provider. Make sure you discuss any questions you have with your health care provider. Document Revised: 07/27/2020 Document Reviewed: 07/27/2020 Elsevier Patient Education  2021 [employer]. http://www.bray.com/.html">  First Trimester of Pregnancy  The first trimester of pregnancy starts on the first day of your last menstrual period until the end of week 12. This is also called months 1 through 3 of pregnancy. Body changes during your first trimester Your body goes through many changes during pregnancy. The changes usually return to normal after your baby is born. Physical changes  You may gain or lose  weight.  Your breasts may grow larger and hurt. The area around your nipples may get darker.  Dark spots or blotches may develop on your face.  You may have changes in your hair. Health changes  You may feel like you might vomit (nauseous), and you may vomit.  You may have heartburn.  You may have headaches.  You may have trouble pooping (constipation).  Your gums may bleed. Other changes  You may get tired easily.  You may pee (urinate) more often.  Your menstrual periods will stop.  You may not feel hungry.  You may want to eat certain kinds of food.  You may have changes in your emotions from day to day.  You may have more dreams. Follow these instructions at home: Medicines  Take over-the-counter and prescription medicines only as told by your doctor. Some medicines are not safe during pregnancy.  Take a prenatal vitamin that contains at least 600 micrograms (mcg) of folic acid. Eating and drinking  Eat healthy meals that include: ? Fresh fruits and vegetables. ? Whole grains. ? Good sources of protein, such as meat, eggs, or tofu. ? Low-fat dairy products.  Avoid raw meat and unpasteurized juice, milk, and cheese.  If you feel like you may vomit, or you vomit: ? Eat 4 or 5 small meals a day instead of 3 large meals. ? Try eating a few soda crackers. ? Drink liquids between meals instead of during meals.  You may need to take these actions to prevent or treat trouble pooping: ? Drink enough fluids to keep your pee (urine) pale yellow. ? Eat foods that are high in fiber. These include beans, whole grains, and fresh fruits and vegetables. ? Limit foods that are high in fat and sugar. These include fried or sweet foods. Activity  Exercise only as told by your doctor. Most people can do their usual exercise routine during pregnancy.  Stop exercising if you have cramps or pain in your lower belly (abdomen) or low back.  Do not exercise if it is too hot  or too humid, or if you are in a place of great height (high altitude).  Avoid heavy lifting.  If you choose to, you may have sex unless your doctor tells you not to. Relieving pain and discomfort  Wear a good support bra if your breasts are sore.  Rest with your legs raised (elevated) if you have leg cramps or low back pain.  If you have bulging  veins (varicose veins) in your legs: ? Wear support hose as told by your doctor. ? Raise your feet for 15 minutes, 3-4 times a day. ? Limit salt in your food. Safety  Wear your seat belt at all times when you are in a car.  Talk with your doctor if someone is hurting you or yelling at you.  Talk with your doctor if you are feeling sad or have thoughts of hurting yourself. Lifestyle  Do not use hot tubs, steam rooms, or saunas.  Do not douche. Do not use tampons or scented sanitary pads.  Do not use herbal medicines, illegal drugs, or medicines that are not approved by your doctor. Do not drink alcohol.  Do not smoke or use any products that contain nicotine or tobacco. If you need help quitting, ask your doctor.  Avoid cat litter boxes and soil that is used by cats. These carry germs that can cause harm to the baby and can cause a loss of your baby by miscarriage or stillbirth. General instructions  Keep all follow-up visits. This is important.  Ask for help if you need counseling or if you need help with nutrition. Your doctor can give you advice or tell you where to go for help.  Visit your dentist. At home, brush your teeth with a soft toothbrush. Floss gently.  Write down your questions. Take them to your prenatal visits. Where to find more information  American Pregnancy Association: americanpregnancy.org  [employer]: www.acog.org  Office on Women's Health: MightyReward.co.nz Contact a doctor if:  You are dizzy.  You have a fever.  You have mild cramps or pressure in  your lower belly.  You have a nagging pain in your belly area.  You continue to feel like you may vomit, you vomit, or you have watery poop (diarrhea) for 24 hours or longer.  You have a bad-smelling fluid coming from your vagina.  You have pain when you pee.  You are exposed to a disease that spreads from person to person, such as chickenpox, measles, Zika virus, HIV, or hepatitis. Get help right away if:  You have spotting or bleeding from your vagina.  You have very bad belly cramping or pain.  You have shortness of breath or chest pain.  You have any kind of injury, such as from a fall or a car crash.  You have new or increased pain, swelling, or redness in an arm or leg. Summary  The first trimester of pregnancy starts on the first day of your last menstrual period until the end of week 12 (months 1 through 3).  Eat 4 or 5 small meals a day instead of 3 large meals.  Do not smoke or use any products that contain nicotine or tobacco. If you need help quitting, ask your doctor.  Keep all follow-up visits. This information is not intended to replace advice given to you by your health care provider. Make sure you discuss any questions you have with your health care provider. Document Revised: 04/12/2020 Document Reviewed: 02/17/2020 Elsevier Patient Education  2021 [employer].

## 2021-03-08 LAB — GC/CHLAMYDIA PROBE AMP (~~LOC~~) NOT AT ARMC
Chlamydia: NEGATIVE
Comment: NEGATIVE
Comment: NORMAL
Neisseria Gonorrhea: NEGATIVE

## 2021-03-20 IMAGING — US US OB < 14 WEEKS - US OB TV
1 series · 13 of 28 positions shown · non-contrast
Comparison: None.

CLINICAL DATA: Vaginal bleeding with positive beta HCG. Recent
elective abortion.

EXAM:
OBSTETRIC <14 WK US AND TRANSVAGINAL OB US
TECHNIQUE: Both transabdominal and transvaginal ultrasound examinations were
performed for complete evaluation of the gestation as well as the
maternal uterus, adnexal regions, and pelvic cul-de-sac.
Transvaginal technique was performed to assess early pregnancy.

[Series 1: us ob < 14 weeks - us ob tv · 13 of 96 slices shown]
[im 4/96]
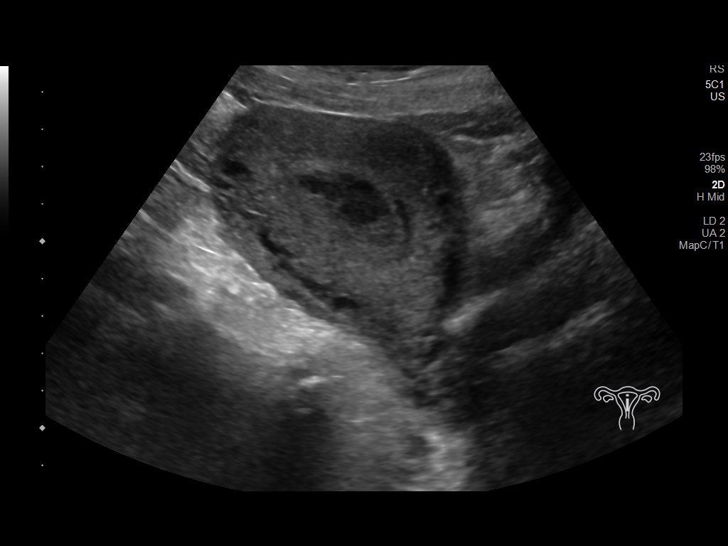
[im 11/96]
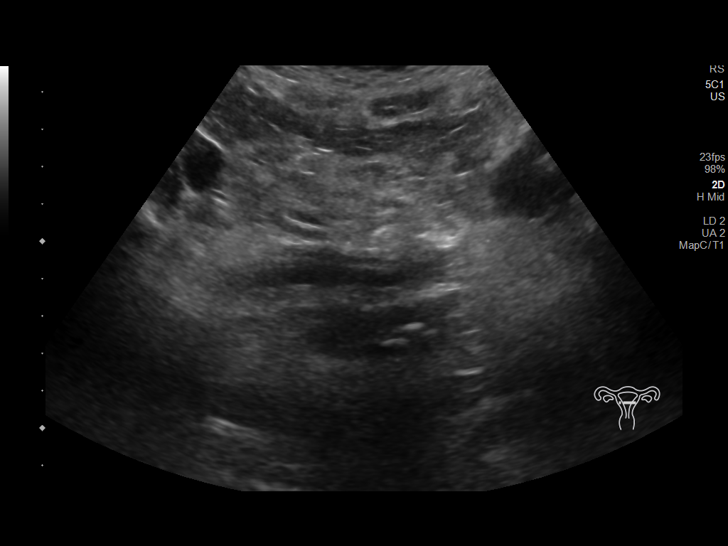
[im 18/96]
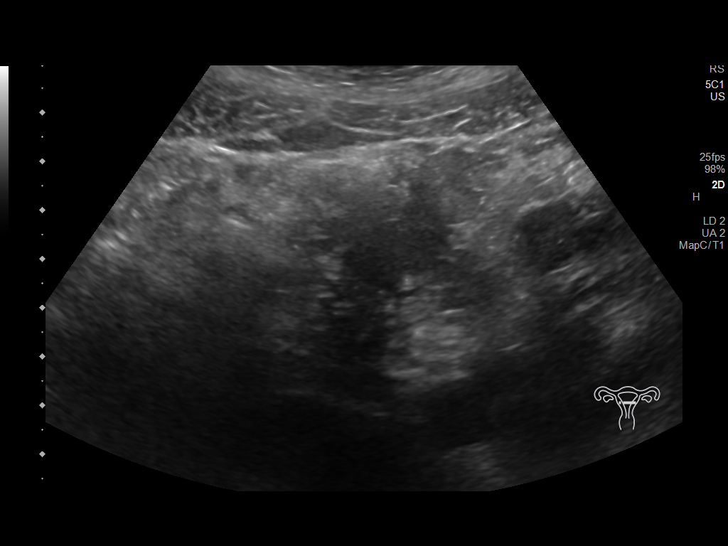
[im 25/96]
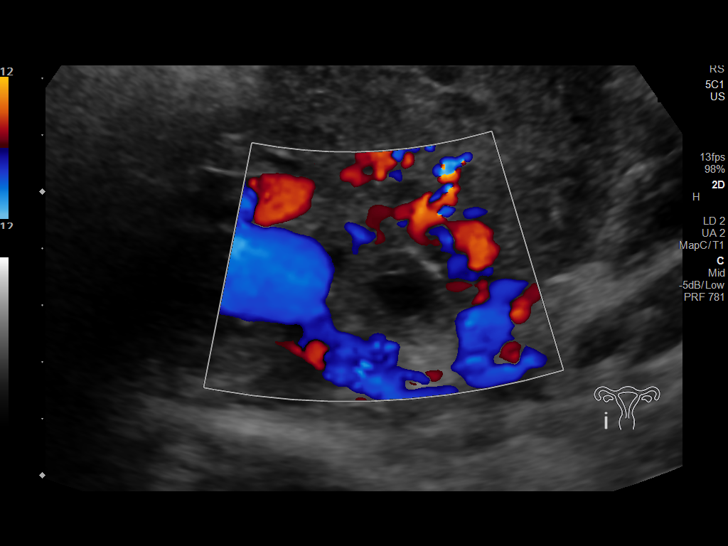
[im 32/96]
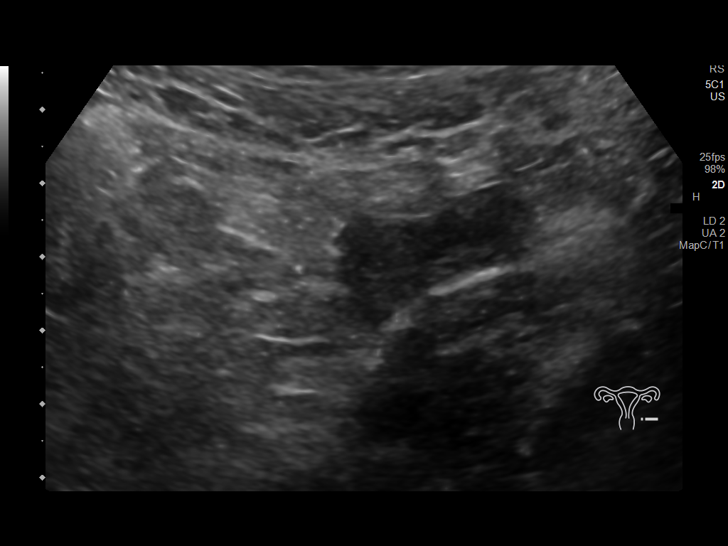
[im 39/96]
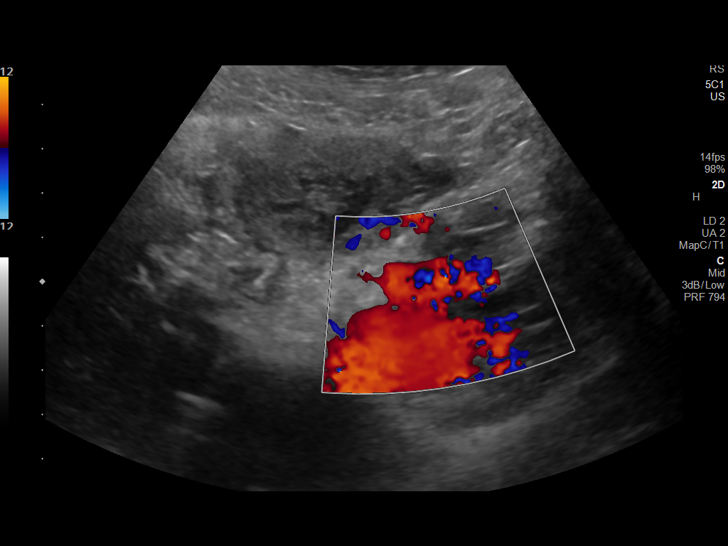
[im 50/96]
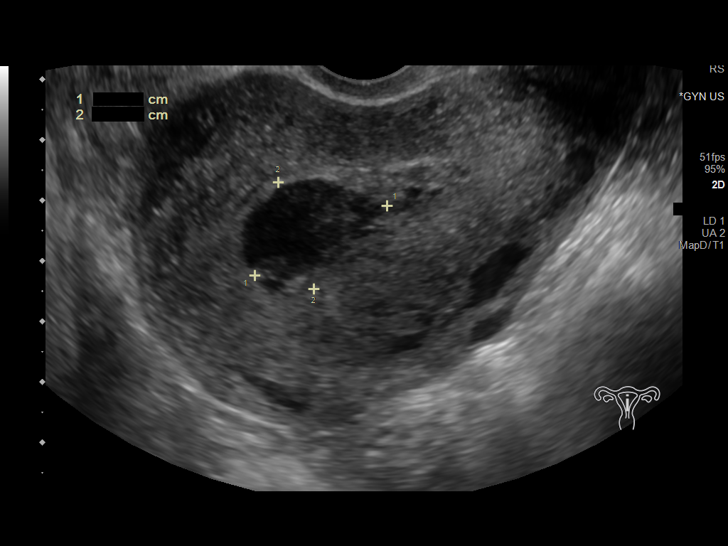
[im 57/96]
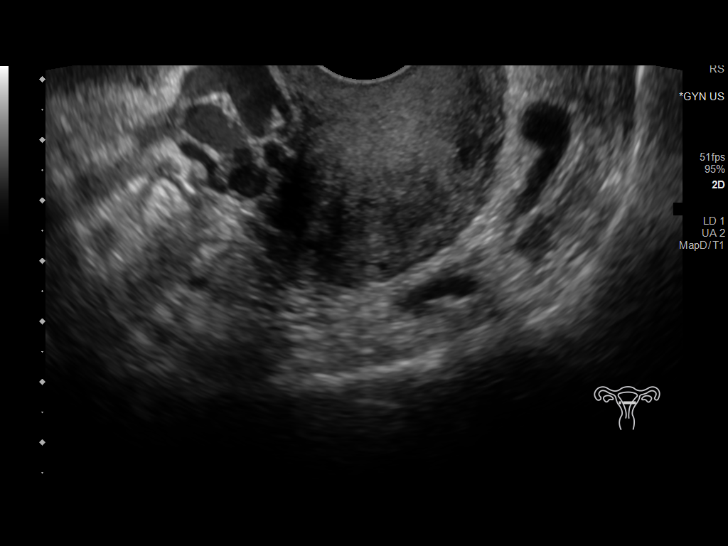
[im 64/96]
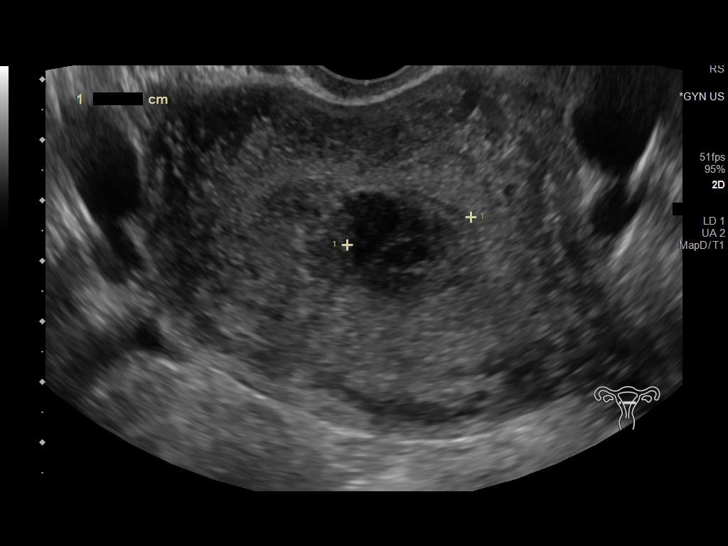
[im 71/96]
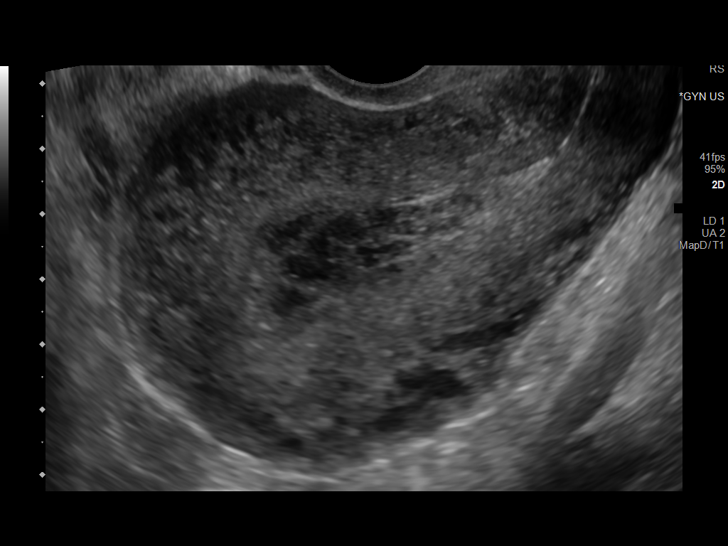
[im 78/96]
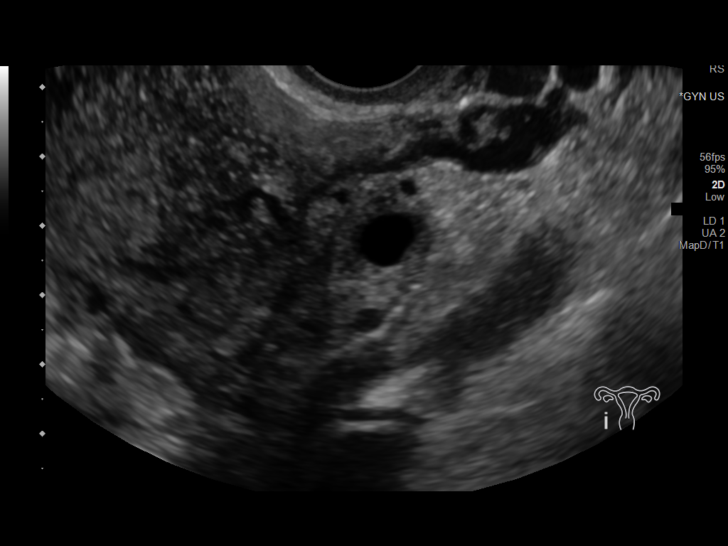
[im 85/96]
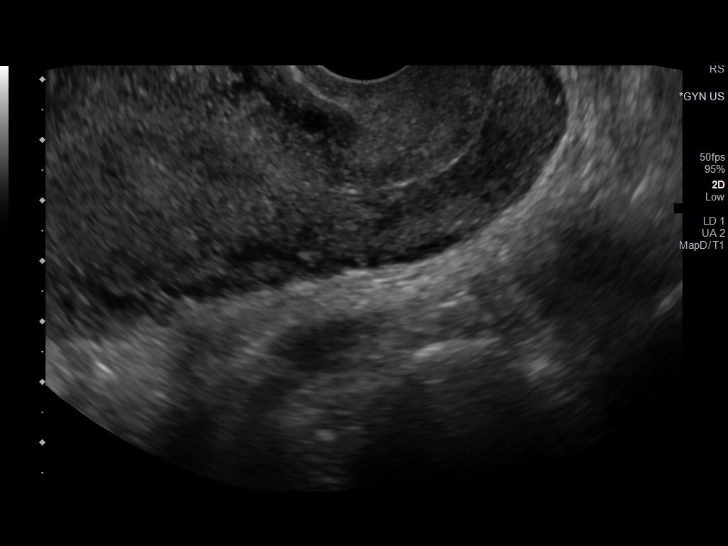
[im 92/96]
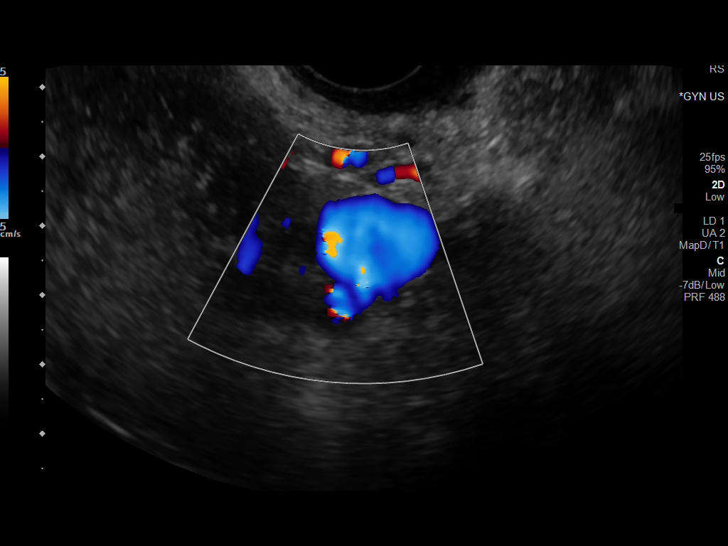

[13 of 28 positions shown; findings below may reference images not displayed]

FINDINGS: Intrauterine gestational sac: Not visualized

Yolk sac:  Not visualized

Embryo:  Not visualized

Cardiac Activity: Not visualized

Subchorionic hemorrhage:  None visualized.

Maternal uterus/adnexae: Cervical os is closed. There is a complex
fluid collection with septations within the me tree a measuring
x 1.9 x 2.1 cm.

No extrauterine pelvic or adnexal mass seen beyond small apparent
corpus luteum on the right. No free pelvic fluid.
IMPRESSION: 1. No intrauterine gestation is evident. There is a complex fluid
collection within the endometrium measuring 2.5 x 1.9 x 2.1 cm.
Question focal hemorrhage following recent abortion versus retained
products of conception. In this regard, gynecologic assessment
advised. Beta HCG should be followed to 0 given this circumstance as
well.

No appreciable extrauterine mass or fluid.

## 2021-04-15 ENCOUNTER — Emergency Department (HOSPITAL_COMMUNITY)
Admission: EM | Admit: 2021-04-15 | Discharge: 2021-04-16 | Disposition: A | Discharge status: Home / Self Care | Payer: BC Managed Care – PPO | Attending: Emergency Medicine | Admitting: Emergency Medicine

## 2021-04-15 ENCOUNTER — Encounter (HOSPITAL_COMMUNITY): Payer: Self-pay | Admitting: *Deleted

## 2021-04-15 ENCOUNTER — Other Ambulatory Visit: Payer: Self-pay

## 2021-04-15 DIAGNOSIS — M545 Low back pain, unspecified: Secondary | ICD-10-CM | POA: Insufficient documentation

## 2021-04-15 DIAGNOSIS — N189 Chronic kidney disease, unspecified: Secondary | ICD-10-CM | POA: Insufficient documentation

## 2021-04-15 DIAGNOSIS — J45909 Unspecified asthma, uncomplicated: Secondary | ICD-10-CM | POA: Insufficient documentation

## 2021-04-15 DIAGNOSIS — F17210 Nicotine dependence, cigarettes, uncomplicated: Secondary | ICD-10-CM | POA: Insufficient documentation

## 2021-04-15 DIAGNOSIS — Z5321 Procedure and treatment not carried out due to patient leaving prior to being seen by health care provider: Secondary | ICD-10-CM | POA: Insufficient documentation

## 2021-04-15 NOTE — ED Triage Notes (Signed)
The pt is c/o lower baCK PAIN SINCE LAST night no idea of what caused it  lmp now

## 2021-04-15 NOTE — ED Provider Notes (Signed)
Emergency Medicine Provider Triage Evaluation Note  Jaquelinne Schuchart , a 25 y.o. female  was evaluated in triage.  Pt complains of sided back pain.  Patient states that she returned shortly to run to the phone yesterday and felt a pull in the right side of her back.  She has been using Tylenol and heating pads with little relief.  Has some pain on inspiration.  Denies any numbness or tingling, no loss of bowel bladder control.  No recent falls or injuries.  Review of Systems  Positive: As above Negative: As above  Physical Exam  LMP 01/13/2021  Gen:   Awake, no distress   Resp:  Normal effort  MSK:   Moves extremities without difficulty  Other:  Midline tenderness to the C, T, L-spine.  Associated paraspinal muscle tenderness to the thoracic spine.  5/5 strength in upper and lower remedies bilaterally  Medical Decision Making  Medically screening exam initiated at 11:13 PM.  Appropriate orders placed.  John Brooking was informed that the remainder of the evaluation will be completed by another provider, this initial triage assessment does not replace that evaluation, and the importance of remaining in the ED until their evaluation is complete.     Mare Ferrari, PA-C 04/15/21 2314    Gwyneth Sprout, MD 04/16/21 [phone]

## 2021-04-16 ENCOUNTER — Emergency Department (HOSPITAL_COMMUNITY)
Admission: EM | Admit: 2021-04-16 | Discharge: 2021-04-16 | Disposition: A | Discharge status: Home / Self Care | Payer: BC Managed Care – PPO | Source: Home / Self Care | Attending: Emergency Medicine | Admitting: Emergency Medicine

## 2021-04-16 ENCOUNTER — Encounter (HOSPITAL_COMMUNITY): Payer: Self-pay | Admitting: Emergency Medicine

## 2021-04-16 DIAGNOSIS — N189 Chronic kidney disease, unspecified: Secondary | ICD-10-CM | POA: Insufficient documentation

## 2021-04-16 DIAGNOSIS — F17210 Nicotine dependence, cigarettes, uncomplicated: Secondary | ICD-10-CM | POA: Insufficient documentation

## 2021-04-16 DIAGNOSIS — M545 Low back pain, unspecified: Secondary | ICD-10-CM | POA: Insufficient documentation

## 2021-04-16 DIAGNOSIS — J45909 Unspecified asthma, uncomplicated: Secondary | ICD-10-CM | POA: Insufficient documentation

## 2021-04-16 MED ORDER — DIAZEPAM 5 MG PO TABS
5.0000 mg | ORAL_TABLET | Freq: Once | ORAL | Status: AC
  Administered 2021-04-16: 5 mg via ORAL
  Filled 2021-04-16: qty 1

## 2021-04-16 MED ORDER — OXYCODONE-ACETAMINOPHEN 5-325 MG PO TABS
1.0000 | ORAL_TABLET | ORAL | Status: DC | PRN
  Administered 2021-04-16: 1 via ORAL
  Filled 2021-04-16: qty 1

## 2021-04-16 MED ORDER — KETOROLAC TROMETHAMINE 15 MG/ML IJ SOLN
15.0000 mg | Freq: Once | INTRAMUSCULAR | Status: AC
  Administered 2021-04-16: 15 mg via INTRAMUSCULAR

## 2021-04-16 MED ORDER — ACETAMINOPHEN 325 MG PO TABS
650.0000 mg | ORAL_TABLET | Freq: Once | ORAL | Status: AC
  Administered 2021-04-16: 650 mg via ORAL
  Filled 2021-04-16: qty 2

## 2021-04-16 MED ORDER — KETOROLAC TROMETHAMINE 15 MG/ML IJ SOLN
15.0000 mg | Freq: Once | INTRAMUSCULAR | Status: DC
  Filled 2021-04-16: qty 1

## 2021-04-16 NOTE — ED Notes (Signed)
Pt seen walking towards ED entrance, when asked by patient advocate if she was leaving pt threw blood pressure cuff up into the air and continued walking.

## 2021-04-16 NOTE — ED Provider Notes (Signed)
[facility]-EMERGENCY DEPT Provider Note   CSN: [id number] Arrival date & time: 04/16/21  6160     History Chief Complaint  Patient presents with  . Back Pain    Yvonne Castillo is a 25 y.o. female.  25 yo F with a chief complaints of right-sided low back pain.  Going on for a few days now.  Patient rapidly turned to pick up her phone and felt pulled to her the right side of her back.  She thinks she likely pulled a muscle.  Pain has worsened since then.  She had trouble getting up out of the bed due to her symptoms.  She denies trauma.  Denies loss of bowel or bladder denies loss of rectal sensation denies numbness or weakness to the legs.  Denies spinal injection denies recent surgery.  She denies abdominal pain nausea vomiting or diarrhea.  Pain is worse with movement palpation and attempting to stand.  Denies fever.  The history is provided by the patient.  Back Pain Location:  Lumbar spine Quality:  Aching Radiates to:  Does not radiate Pain severity:  Moderate Onset quality:  Sudden Duration:  3 days Timing:  Constant Progression:  Worsening Chronicity:  Recurrent Relieved by:  Nothing Worsened by:  Twisting, touching, standing, movement, bending and bowel movement Ineffective treatments:  Ibuprofen and NSAIDs Associated symptoms: no chest pain, no dysuria, no fever and no headaches        Past Medical History:  Diagnosis Date  . Anemia    following PPH  . Anxiety   . Asthma    as child no current issues or medications  . Chronic kidney disease    kidney stones-"yearly"    Patient Active Problem List   Diagnosis Date Noted  . Supervision of high risk pregnancy, antepartum, second trimester 05/11/2018  . Hx of preeclampsia, prior pregnancy, currently pregnant 03/25/2014  . Asthma 03/21/2014  . Nephrolithiasis 02/18/2014    Past Surgical History:  Procedure Laterality Date  . WISDOM TOOTH EXTRACTION       OB History    Gravida  14    Para  4   Term  3   Preterm  1   AB  9   Living  3     SAB  2   IAB  7   Ectopic  0   Multiple  0   Live Births  4           Family History  Problem Relation Age of Onset  . Anemia Mother   . Depression Mother   . Asthma Brother   . Diabetes Maternal Grandmother   . Heart disease Paternal Grandfather     Social History   Tobacco Use  . Smoking status: Current Some Day Smoker    Packs/day: 0.25    Types: Cigars, Cigarettes  . Smokeless tobacco: Never Used  . Tobacco comment: black and milds  Substance Use Topics  . Alcohol use: Not Currently    Alcohol/week: 0.0 standard drinks    Comment: Stopped April 2019  . Drug use: No    Home Medications Prior to Admission medications   Not on File    Allergies    Patient has no known allergies.  Review of Systems   Review of Systems  Constitutional: Negative for chills and fever.  HENT: Negative for congestion and rhinorrhea.   Eyes: Negative for redness and visual disturbance.  Respiratory: Negative for shortness of breath and wheezing.  Cardiovascular: Negative for chest pain and palpitations.  Gastrointestinal: Negative for nausea and vomiting.  Genitourinary: Negative for dysuria and urgency.  Musculoskeletal: Positive for back pain. Negative for arthralgias and myalgias.  Skin: Negative for pallor and wound.  Neurological: Negative for dizziness and headaches.    Physical Exam Updated Vital Signs BP (!) 162/107 (BP Location: Right Arm)   Pulse 78   Temp 98.2 F (36.8 C) (Oral)   Resp 18   Ht 5\' 3"  (1.6 m)   Wt 76.2 kg   LMP 01/13/2021   SpO2 100%   BMI 29.76 kg/m   Physical Exam Vitals and nursing note reviewed.  Constitutional:      General: She is not in acute distress.    Appearance: She is well-developed. She is not diaphoretic.  HENT:     Head: Normocephalic and atraumatic.  Eyes:     Pupils: Pupils are equal, round, and reactive to light.  Cardiovascular:     Rate and  Rhythm: Normal rate and regular rhythm.     Heart sounds: No murmur heard. No friction rub. No gallop.   Pulmonary:     Effort: Pulmonary effort is normal.     Breath sounds: No wheezing or rales.  Abdominal:     General: There is no distension.     Palpations: Abdomen is soft.     Tenderness: There is no abdominal tenderness.  Musculoskeletal:        General: Tenderness present.     Cervical back: Normal range of motion and neck supple.     Comments: Tenderness to the right paraspinal musculature about the L-spine.  No piriformis tenderness.  Negative straight leg raise test bilaterally.  Pulse motor and sensation intact distally.  Skin:    General: Skin is warm and dry.  Neurological:     Mental Status: She is alert and oriented to person, place, and time.  Psychiatric:        Behavior: Behavior normal.     ED Results / Procedures / Treatments   Labs (all labs ordered are listed, but only abnormal results are displayed) Labs Reviewed - No data to display  EKG None  Radiology No results found.  Procedures Procedures   Medications Ordered in ED Medications  oxyCODONE-acetaminophen (PERCOCET/ROXICET) 5-325 MG per tablet 1 tablet (1 tablet Oral Given 04/16/21 0414)  ketorolac (TORADOL) 15 MG/ML injection 15 mg (has no administration in time range)  acetaminophen (TYLENOL) tablet 650 mg (has no administration in time range)  diazepam (VALIUM) tablet 5 mg (has no administration in time range)    ED Course  I have reviewed the triage vital signs and the nursing notes.  Pertinent labs & imaging results that were available during my care of the patient were reviewed by me and considered in my medical decision making (see chart for details).    MDM Rules/Calculators/A&P                          25 yo F with a chief complaints of right-sided low back pain.  Going on for about 3 days now.  Patient without red flags.  Well-appearing nontoxic.  Benign exam.  Old records  were reviewed and the patient had a recent visit for pregnant vaginal bleeding.  Patient ended up electing to terminate her pregnancy.  Patient has no abdominal symptoms.  No vaginal bleeding.  We will treat symptomatically.  PCP follow-up.  4:40 AM:  I have discussed  the diagnosis/risks/treatment options with the patient and believe the pt to be eligible for discharge home to follow-up with PCP. We also discussed returning to the ED immediately if new or worsening sx occur. We discussed the sx which are most concerning (e.g., sudden worsening pain, fever, inability to tolerate by mouth) that necessitate immediate return. Medications administered to the patient during their visit and any new prescriptions provided to the patient are listed below.  Medications given during this visit Medications  oxyCODONE-acetaminophen (PERCOCET/ROXICET) 5-325 MG per tablet 1 tablet (1 tablet Oral Given 04/16/21 0414)  ketorolac (TORADOL) 15 MG/ML injection 15 mg (has no administration in time range)  acetaminophen (TYLENOL) tablet 650 mg (has no administration in time range)  diazepam (VALIUM) tablet 5 mg (has no administration in time range)     The patient appears reasonably screen and/or stabilized for discharge and I doubt any other medical condition or other [facility] requiring further screening, evaluation, or treatment in the ED at this time prior to discharge.   Final Clinical Impression(s) / ED Diagnoses Final diagnoses:  Acute right-sided low back pain without sciatica    Rx / DC Orders ED Discharge Orders    None       Melene Plan, DO 04/16/21 [phone]

## 2021-04-16 NOTE — Discharge Instructions (Signed)

## 2021-04-16 NOTE — ED Triage Notes (Signed)
Patient arrives complaining of back pain. Patient states she twisted and pulled a muscle and is now unable to turn. Patient tearful in triage.

## 2021-12-16 ENCOUNTER — Emergency Department (HOSPITAL_COMMUNITY): Payer: 59

## 2021-12-16 ENCOUNTER — Other Ambulatory Visit: Payer: Self-pay

## 2021-12-16 ENCOUNTER — Emergency Department (HOSPITAL_COMMUNITY)
Admission: EM | Admit: 2021-12-16 | Discharge: 2021-12-16 | Disposition: A | Discharge status: Home / Self Care | Payer: 59 | Attending: Emergency Medicine | Admitting: Emergency Medicine

## 2021-12-16 ENCOUNTER — Encounter (HOSPITAL_COMMUNITY): Payer: Self-pay

## 2021-12-16 DIAGNOSIS — N9489 Other specified conditions associated with female genital organs and menstrual cycle: Secondary | ICD-10-CM | POA: Diagnosis not present

## 2021-12-16 DIAGNOSIS — R109 Unspecified abdominal pain: Secondary | ICD-10-CM | POA: Diagnosis present

## 2021-12-16 DIAGNOSIS — N200 Calculus of kidney: Secondary | ICD-10-CM

## 2021-12-16 DIAGNOSIS — N132 Hydronephrosis with renal and ureteral calculous obstruction: Secondary | ICD-10-CM | POA: Insufficient documentation

## 2021-12-16 LAB — CBC WITH DIFFERENTIAL/PLATELET
Abs Immature Granulocytes: 0.07 10*3/uL (ref 0.00–0.07)
Basophils Absolute: 0 10*3/uL (ref 0.0–0.1)
Basophils Relative: 0 %
Eosinophils Absolute: 0 10*3/uL (ref 0.0–0.5)
Eosinophils Relative: 0 %
HCT: 44.9 % (ref 36.0–46.0)
Hemoglobin: 14.6 g/dL (ref 12.0–15.0)
Immature Granulocytes: 0 %
Lymphocytes Relative: 5 %
Lymphs Abs: 0.8 10*3/uL (ref 0.7–4.0)
MCH: 29.8 pg (ref 26.0–34.0)
MCHC: 32.5 g/dL (ref 30.0–36.0)
MCV: 91.6 fL (ref 80.0–100.0)
Monocytes Absolute: 0.4 10*3/uL (ref 0.1–1.0)
Monocytes Relative: 3 %
Neutro Abs: 14.5 10*3/uL — ABNORMAL HIGH (ref 1.7–7.7)
Neutrophils Relative %: 92 %
Platelets: 264 10*3/uL (ref 150–400)
RBC: 4.9 MIL/uL (ref 3.87–5.11)
RDW: 14.5 % (ref 11.5–15.5)
WBC: 15.8 10*3/uL — ABNORMAL HIGH (ref 4.0–10.5)
nRBC: 0 % (ref 0.0–0.2)

## 2021-12-16 LAB — URINALYSIS, ROUTINE W REFLEX MICROSCOPIC
Bacteria, UA: NONE SEEN
Bilirubin Urine: NEGATIVE
Glucose, UA: NEGATIVE mg/dL
Ketones, ur: NEGATIVE mg/dL
Leukocytes,Ua: NEGATIVE
Nitrite: NEGATIVE
Protein, ur: 30 mg/dL — AB
RBC / HPF: >50 RBC/hpf — ABNORMAL HIGH (ref 0–5)
Specific Gravity, Urine: 1.024 (ref 1.005–1.030)
pH: 7 (ref 5.0–8.0)

## 2021-12-16 LAB — BASIC METABOLIC PANEL
Anion gap: 6 (ref 5–15)
BUN: 11 mg/dL (ref 6–20)
CO2: 24 mmol/L (ref 22–32)
Calcium: 9.2 mg/dL (ref 8.9–10.3)
Chloride: 108 mmol/L (ref 98–111)
Creatinine, Ser: 0.88 mg/dL (ref 0.44–1.00)
GFR, Estimated: >60 mL/min (ref >60)
Glucose, Bld: 110 mg/dL — ABNORMAL HIGH (ref 70–99)
Potassium: 3.9 mmol/L (ref 3.5–5.1)
Sodium: 138 mmol/L (ref 135–145)

## 2021-12-16 LAB — I-STAT BETA HCG BLOOD, ED (MC, WL, AP ONLY): I-stat hCG, quantitative: <5 m[IU]/mL (ref <5)

## 2021-12-16 MED ORDER — TAMSULOSIN HCL 0.4 MG PO CAPS: 0.4000 mg | ORAL_CAPSULE | Freq: Every day | ORAL | 0 refills | Status: DC

## 2021-12-16 MED ORDER — MORPHINE SULFATE (PF) 2 MG/ML IV SOLN
2.0000 mg | Freq: Once | INTRAVENOUS | Status: AC
  Administered 2021-12-16: 2 mg via INTRAVENOUS
  Filled 2021-12-16: qty 1

## 2021-12-16 MED ORDER — HYDROCODONE-ACETAMINOPHEN 5-325 MG PO TABS
2.0000 | ORAL_TABLET | ORAL | 0 refills | Status: DC | PRN
Start: 2021-12-16 — End: 2021-12-16

## 2021-12-16 MED ORDER — HYDROCODONE-ACETAMINOPHEN 5-325 MG PO TABS
2.0000 | ORAL_TABLET | ORAL | 0 refills | Status: DC | PRN
Start: 2021-12-16 — End: 2022-04-01

## 2021-12-16 MED ORDER — ONDANSETRON HCL 4 MG PO TABS: 4.0000 mg | ORAL_TABLET | Freq: Four times a day (QID) | ORAL | 0 refills | Status: DC

## 2021-12-16 MED ORDER — ONDANSETRON HCL 4 MG/2ML IJ SOLN
4.0000 mg | Freq: Once | INTRAMUSCULAR | Status: AC
  Administered 2021-12-16: 4 mg via INTRAVENOUS
  Filled 2021-12-16: qty 2

## 2021-12-16 NOTE — Discharge Instructions (Addendum)
Please use Tylenol or ibuprofen for pain.  You may use 600 mg ibuprofen every 6 hours or 1000 mg of Tylenol every 6 hours.  You may choose to alternate between the 2.  This would be most effective.  Not to exceed 4 g of Tylenol within 24 hours.  Not to exceed 3200 mg ibuprofen 24 hours.  You can use the norco in place of tylenol up to every 6 hours. Follow up with Urology at your convenience if you have any concern for not passing the kidney stone.  If you become unable to urinate please return to the emergency department for immediate evaluation.  As we discussed the radiologist noted a 8mm lesion on your liver, they believe that it likely represents a benign mass, however they recommend follow-up imaging. You can set this up with a PCP, or try to consult with [facility] and [employer] as we discussed.

## 2021-12-16 NOTE — ED Notes (Signed)
Pt states understanding of dc instructions, importance of follow up, work note, and prescriptions. Pt denies questions or concerns upon dc. Pt declined wheelchair assistance upon dc. Pt ambulated out of ed w/ steady gait. No belongings left in room upon dc.  

## 2021-12-16 NOTE — ED Provider Notes (Signed)
[facility]-EMERGENCY DEPT Provider Note   CSN: [id number] Arrival date & time: 12/16/21  1401     History  Chief Complaint  Patient presents with   Flank Pain    Onalee Pendell is a 26 y.o. female with past medical history significant for multiple kidney stones in the past over the last 4 years.  Patient reports that they are typically calcium stones.  Patient does follow with the urology group.  Patient reports acute onset left flank pain at 10:30 AM this morning along with nausea.  Patient denies any significant dysuria.  She denies hematuria.  Patient denies diarrhea, fever or chills, constipation.  Patient does have some feeling of incompletely emptying the bladder.    Flank Pain      Home Medications Prior to Admission medications   Medication Sig Start Date End Date Taking? Authorizing Provider  HYDROcodone-acetaminophen (NORCO/VICODIN) 5-325 MG tablet Take 2 tablets by mouth every 4 (four) hours as needed. 12/16/21   Gwyn Mehring H, PA-C  ondansetron (ZOFRAN) 4 MG tablet Take 1 tablet (4 mg total) by mouth every 6 (six) hours. 12/16/21   Kerrigan Gombos H, PA-C  tamsulosin (FLOMAX) 0.4 MG CAPS capsule Take 1 capsule (0.4 mg total) by mouth daily. 12/16/21   Mee Macdonnell H, PA-C      Allergies    Patient has no known allergies.    Review of Systems   Review of Systems  Gastrointestinal:  Positive for nausea and vomiting.  Genitourinary:  Positive for flank pain.  All other systems reviewed and are negative.  Physical Exam Updated Vital Signs BP (!) 147/94 (BP Location: Right Arm)    Pulse 68    Temp 98 F (36.7 C) (Oral)    Resp 18    Ht 5\' 2"  (1.575 m)    Wt 73.5 kg    LMP 01/13/2021    SpO2 99%    BMI 29.63 kg/m  Physical Exam Vitals and nursing note reviewed.  Constitutional:      General: She is not in acute distress.    Appearance: Normal appearance.  HENT:     Head: Normocephalic and atraumatic.  Eyes:     General:         Right eye: No discharge.        Left eye: No discharge.  Cardiovascular:     Rate and Rhythm: Normal rate and regular rhythm.     Heart sounds: No murmur heard.   No friction rub. No gallop.  Pulmonary:     Effort: Pulmonary effort is normal.     Breath sounds: Normal breath sounds.  Abdominal:     General: Bowel sounds are normal.     Palpations: Abdomen is soft.     Comments: Patient with significant tenderness palpation over suprapubic, with positive left CVA tenderness.  No right CVA tenderness, no rebound, rigidity, guarding throughout.  Skin:    General: Skin is warm and dry.     Capillary Refill: Capillary refill takes less than 2 seconds.  Neurological:     Mental Status: She is alert and oriented to person, place, and time.  Psychiatric:        Mood and Affect: Mood normal.        Behavior: Behavior normal.    ED Results / Procedures / Treatments   Labs (all labs ordered are listed, but only abnormal results are displayed) Labs Reviewed  CBC WITH DIFFERENTIAL/PLATELET - Abnormal; Notable for the following components:  Result Value   WBC 15.8 (*)    Neutro Abs 14.5 (*)    All other components within normal limits  BASIC METABOLIC PANEL - Abnormal; Notable for the following components:   Glucose, Bld 110 (*)    All other components within normal limits  URINALYSIS, ROUTINE W REFLEX MICROSCOPIC - Abnormal; Notable for the following components:   APPearance CLOUDY (*)    Hgb urine dipstick LARGE (*)    Protein, ur 30 (*)    RBC / HPF >50 (*)    All other components within normal limits  I-STAT BETA HCG BLOOD, ED ([facility], [facility], [facility] ONLY)    EKG None  Radiology CT Renal Stone Study  Result Date: 12/16/2021 CLINICAL DATA:  Left flank pain. Renal stone suspected. History of kidney stones. EXAM: CT ABDOMEN AND PELVIS WITHOUT CONTRAST TECHNIQUE: Multidetector CT imaging of the abdomen and pelvis was performed following the standard protocol without IV contrast.  RADIATION DOSE REDUCTION: This exam was performed according to the departmental dose-optimization program which includes automated exposure control, adjustment of the mA and/or kV according to patient size and/or use of iterative reconstruction technique. COMPARISON:  December 11, 2019 FINDINGS: Lower chest: No acute abnormality. Hepatobiliary: Subtle hypodense 16 mm lesion in the posterior right lobe of the liver is incompletely characterized on this non contrasted study but statistically likely to reflect a benign etiology. Gallbladder is unremarkable. No biliary ductal dilation. Pancreas: No pancreatic ductal dilation or evidence of acute inflammation. Spleen: Within normal limits. Adrenals/Urinary Tract: Bilateral adrenal glands appear normal. Edematous appearance of the left kidney with hydronephrosis to the level of a 3 mm stone just below the ureteropelvic junction on image 37/2. There are a few additional punctate nonobstructive bilateral renal calculi. Urinary bladder is unremarkable for degree of distension. Stomach/Bowel: No enteric contrast was administered. Stomach is unremarkable for degree of distension. No pathologic dilation of small or large bowel. The appendix and terminal ileum appear normal. No evidence of acute bowel inflammation. Vascular/Lymphatic: No significant vascular findings are present. No enlarged abdominal or pelvic lymph nodes. Reproductive: Unremarkable premenopausal non contrasted CT appearance of the uterus and adnexa. Other: No significant abdominopelvic free fluid. Musculoskeletal: No acute or significant osseous findings. IMPRESSION: 1. Left-sided hydronephrosis to the level of a 3 mm stone just below the ureteropelvic junction. 2. Additional punctate nonobstructive bilateral renal calculi. 3. Subtle hypodense 16 mm lesion in the posterior right lobe of the liver is incompletely characterized on this non contrasted study but statistically likely to reflect a benign etiology.  Electronically Signed   By: Dahlia Bailiff M.D.   On: 12/16/2021 16:25    Procedures Procedures    Medications Ordered in ED Medications  ondansetron (ZOFRAN) injection 4 mg (4 mg Intravenous Given 12/16/21 1521)  morphine 2 MG/ML injection 2 mg (2 mg Intravenous Given 12/16/21 1523)    ED Course/ Medical Decision Making/ A&P                           I discussed this case with my attending physician who cosigned this note including patient's presenting symptoms, physical exam, and planned diagnostics and interventions. Attending physician stated agreement with plan or made changes to plan which were implemented.   Medical Decision Making This 26 year old patient with a history of kidney stones who presents with left flank pain.  Also some left suprapubic pain.  Feeling of incomplete bladder emptying.  My emergent differential diagnosis includes but is  not limited to urinary tract infection, pyelonephritis, nephrolithiasis, less clinical suspicion for acute ovarian torsion, ectopic pregnancy, diverticulitis.  This is not an exhaustive differential.    Problems Addressed: Nephrolithiasis: acute illness or injury    Details: I obtained and reviewed CT renal stone study which showed 3 mm stone on the left just below the UPJ.  They also noted intrarenal stones.  Hydronephrosis noted of the left kidney.  Treated pain, nausea while patient was being evaluated, urinalysis shows no sign of urinary tract infection, will discharge with pain medication, nausea medication, Flomax, and encourage follow-up with urology.  Amount and/or Complexity of Data Reviewed Independent Historian: parent External Data Reviewed: labs, radiology and notes.    Details: Independently reviewed lab work which is significant for urinalysis showing ketones, hemoglobin, no evidence of bacteria, nitrites, leukocytes.  Clinically does not appear suspicious for urinary tract infection.  Patient with elevated white blood cell  count to 15.8, no anemia noted.  BMP unremarkable, normal creatinine.  Patient is not pregnant.  Independently reviewed and interpreted radiology including CT renal stone study which was positive for 3 mm stone on the left as described above.  I agree with radiologist interpretation.  Additionally noted on the CT study is a 66mm hypodense mass thought to be likely benign by radiology, encourage patient to follow-up for repeat imaging at her earliest convenience.  Patient will establish care with a primary care doctor in order to have this imaging performed. Labs: ordered. Decision-making details documented in ED Course. Radiology: ordered. Decision-making details documented in ED Course.  Risk Prescription drug management.   Patient with simple less than 6 mm stone, will treat with outpatient management with pain medication, nausea medication, Flomax.  Encouraged to follow-up with urology. Final Clinical Impression(s) / ED Diagnoses Final diagnoses:  Nephrolithiasis    Rx / DC Orders ED Discharge Orders          Ordered    HYDROcodone-acetaminophen (NORCO/VICODIN) 5-325 MG tablet  Every 4 hours PRN,   Status:  Discontinued        12/16/21 1722    ondansetron (ZOFRAN) 4 MG tablet  Every 6 hours,   Status:  Discontinued        12/16/21 1722    tamsulosin (FLOMAX) 0.4 MG CAPS capsule  Daily,   Status:  Discontinued        12/16/21 1722    HYDROcodone-acetaminophen (NORCO/VICODIN) 5-325 MG tablet  Every 4 hours PRN        12/16/21 1735    ondansetron (ZOFRAN) 4 MG tablet  Every 6 hours        12/16/21 1735    tamsulosin (FLOMAX) 0.4 MG CAPS capsule  Daily        12/16/21 1735              Anselmo Pickler, PA-C 12/16/21 1845    Drenda Freeze, MD 12/16/21 2330

## 2021-12-16 NOTE — ED Triage Notes (Signed)
Pt reports left sided flank pain that began this morning. Pt reports being unable to urinate fully. Hx of kidney stones.

## 2022-03-12 ENCOUNTER — Emergency Department (HOSPITAL_COMMUNITY)
Admission: EM | Admit: 2022-03-12 | Discharge: 2022-03-12 | Discharge status: Against Medical Advice | Payer: 59 | Attending: Emergency Medicine | Admitting: Emergency Medicine

## 2022-03-12 ENCOUNTER — Other Ambulatory Visit: Payer: Self-pay

## 2022-03-12 ENCOUNTER — Encounter (HOSPITAL_COMMUNITY): Payer: Self-pay

## 2022-03-12 DIAGNOSIS — Z5321 Procedure and treatment not carried out due to patient leaving prior to being seen by health care provider: Secondary | ICD-10-CM | POA: Diagnosis not present

## 2022-03-12 DIAGNOSIS — R109 Unspecified abdominal pain: Secondary | ICD-10-CM | POA: Diagnosis present

## 2022-03-12 LAB — CBC WITH DIFFERENTIAL/PLATELET
Abs Immature Granulocytes: 0.05 10*3/uL (ref 0.00–0.07)
Basophils Absolute: 0 10*3/uL (ref 0.0–0.1)
Basophils Relative: 0 %
Eosinophils Absolute: 0.3 10*3/uL (ref 0.0–0.5)
Eosinophils Relative: 3 %
HCT: 35.9 % — ABNORMAL LOW (ref 36.0–46.0)
Hemoglobin: 11.8 g/dL — ABNORMAL LOW (ref 12.0–15.0)
Immature Granulocytes: 1 %
Lymphocytes Relative: 14 %
Lymphs Abs: 1.5 10*3/uL (ref 0.7–4.0)
MCH: 30.6 pg (ref 26.0–34.0)
MCHC: 32.9 g/dL (ref 30.0–36.0)
MCV: 93.2 fL (ref 80.0–100.0)
Monocytes Absolute: 0.5 10*3/uL (ref 0.1–1.0)
Monocytes Relative: 5 %
Neutro Abs: 8.2 10*3/uL — ABNORMAL HIGH (ref 1.7–7.7)
Neutrophils Relative %: 77 %
Platelets: 285 10*3/uL (ref 150–400)
RBC: 3.85 MIL/uL — ABNORMAL LOW (ref 3.87–5.11)
RDW: 14.6 % (ref 11.5–15.5)
WBC: 10.6 10*3/uL — ABNORMAL HIGH (ref 4.0–10.5)
nRBC: 0 % (ref 0.0–0.2)

## 2022-03-12 LAB — COMPREHENSIVE METABOLIC PANEL
ALT: 14 U/L (ref 0–44)
AST: 19 U/L (ref 15–41)
Albumin: 4.1 g/dL (ref 3.5–5.0)
Alkaline Phosphatase: 59 U/L (ref 38–126)
Anion gap: 8 (ref 5–15)
BUN: 12 mg/dL (ref 6–20)
CO2: 22 mmol/L (ref 22–32)
Calcium: 8.6 mg/dL — ABNORMAL LOW (ref 8.9–10.3)
Chloride: 109 mmol/L (ref 98–111)
Creatinine, Ser: 0.93 mg/dL (ref 0.44–1.00)
GFR, Estimated: >60 mL/min (ref >60)
Glucose, Bld: 112 mg/dL — ABNORMAL HIGH (ref 70–99)
Potassium: 3.7 mmol/L (ref 3.5–5.1)
Sodium: 139 mmol/L (ref 135–145)
Total Bilirubin: 0.4 mg/dL (ref 0.3–1.2)
Total Protein: 6.9 g/dL (ref 6.5–8.1)

## 2022-03-12 LAB — URINALYSIS, ROUTINE W REFLEX MICROSCOPIC
Bacteria, UA: NONE SEEN
Bilirubin Urine: NEGATIVE
Glucose, UA: NEGATIVE mg/dL
Ketones, ur: NEGATIVE mg/dL
Leukocytes,Ua: NEGATIVE
Nitrite: NEGATIVE
Protein, ur: NEGATIVE mg/dL
RBC / HPF: >50 RBC/hpf — ABNORMAL HIGH (ref 0–5)
Specific Gravity, Urine: 1.017 (ref 1.005–1.030)
pH: 7 (ref 5.0–8.0)

## 2022-03-12 LAB — I-STAT BETA HCG BLOOD, ED (MC, WL, AP ONLY): I-stat hCG, quantitative: 1179.2 m[IU]/mL — ABNORMAL HIGH (ref <5)

## 2022-03-12 LAB — LIPASE, BLOOD: Lipase: 26 U/L (ref 11–51)

## 2022-03-12 NOTE — ED Notes (Signed)
Pt reports having a miscarriage 2 months ago.  ?

## 2022-03-12 NOTE — ED Notes (Signed)
Pt states that she feels that her kidney stone has passed. Pt is leaving.  ?

## 2022-03-12 NOTE — ED Triage Notes (Signed)
Pt reports with right flank pain since 9 pm last night and states that she feels like she has another kidney stone.  ?

## 2022-03-31 ENCOUNTER — Inpatient Hospital Stay (HOSPITAL_COMMUNITY)
Admission: EM | Admit: 2022-03-31 | Discharge: 2022-04-01 | Disposition: A | Discharge status: Home / Self Care | Payer: 59 | Attending: Family Medicine | Admitting: Family Medicine

## 2022-03-31 DIAGNOSIS — O021 Missed abortion: Secondary | ICD-10-CM

## 2022-03-31 NOTE — ED Triage Notes (Signed)
Pt reports that she took the pill on 4/15. States that she was bleeding intially, but hasnt for the last 2 weeks until today. States that since 9p, she has saturated 5-6 pads, with approx 10cm in diameter clots. Endorses dizziness. ?

## 2022-04-01 ENCOUNTER — Emergency Department (HOSPITAL_COMMUNITY): Payer: 59

## 2022-04-01 ENCOUNTER — Inpatient Hospital Stay (HOSPITAL_COMMUNITY): Payer: 59

## 2022-04-01 ENCOUNTER — Other Ambulatory Visit: Payer: Self-pay

## 2022-04-01 ENCOUNTER — Encounter (HOSPITAL_COMMUNITY): Payer: Self-pay | Admitting: Emergency Medicine

## 2022-04-01 DIAGNOSIS — O021 Missed abortion: Secondary | ICD-10-CM | POA: Diagnosis present

## 2022-04-01 LAB — URINALYSIS, ROUTINE W REFLEX MICROSCOPIC
Bacteria, UA: NONE SEEN
Bilirubin Urine: NEGATIVE
Glucose, UA: NEGATIVE mg/dL
Ketones, ur: NEGATIVE mg/dL
Nitrite: NEGATIVE
Protein, ur: 30 mg/dL — AB
RBC / HPF: >50 RBC/hpf — ABNORMAL HIGH (ref 0–5)
Specific Gravity, Urine: 1.011 (ref 1.005–1.030)
pH: 6 (ref 5.0–8.0)

## 2022-04-01 LAB — HCG, QUANTITATIVE, PREGNANCY: hCG, Beta Chain, Quant, S: 41 m[IU]/mL — ABNORMAL HIGH (ref <5)

## 2022-04-01 LAB — CBC WITH DIFFERENTIAL/PLATELET
Abs Immature Granulocytes: 0.04 10*3/uL (ref 0.00–0.07)
Basophils Absolute: 0.1 10*3/uL (ref 0.0–0.1)
Basophils Relative: 1 %
Eosinophils Absolute: 0.6 10*3/uL — ABNORMAL HIGH (ref 0.0–0.5)
Eosinophils Relative: 5 %
HCT: 34.8 % — ABNORMAL LOW (ref 36.0–46.0)
Hemoglobin: 11.6 g/dL — ABNORMAL LOW (ref 12.0–15.0)
Immature Granulocytes: 0 %
Lymphocytes Relative: 21 %
Lymphs Abs: 2.3 10*3/uL (ref 0.7–4.0)
MCH: 30.9 pg (ref 26.0–34.0)
MCHC: 33.3 g/dL (ref 30.0–36.0)
MCV: 92.6 fL (ref 80.0–100.0)
Monocytes Absolute: 0.6 10*3/uL (ref 0.1–1.0)
Monocytes Relative: 5 %
Neutro Abs: 7.7 10*3/uL (ref 1.7–7.7)
Neutrophils Relative %: 68 %
Platelets: 258 10*3/uL (ref 150–400)
RBC: 3.76 MIL/uL — ABNORMAL LOW (ref 3.87–5.11)
RDW: 14.5 % (ref 11.5–15.5)
WBC: 11.3 10*3/uL — ABNORMAL HIGH (ref 4.0–10.5)
nRBC: 0 % (ref 0.0–0.2)

## 2022-04-01 LAB — COMPREHENSIVE METABOLIC PANEL
ALT: 12 U/L (ref 0–44)
AST: 17 U/L (ref 15–41)
Albumin: 4 g/dL (ref 3.5–5.0)
Alkaline Phosphatase: 75 U/L (ref 38–126)
Anion gap: 6 (ref 5–15)
BUN: 8 mg/dL (ref 6–20)
CO2: 24 mmol/L (ref 22–32)
Calcium: 8.8 mg/dL — ABNORMAL LOW (ref 8.9–10.3)
Chloride: 108 mmol/L (ref 98–111)
Creatinine, Ser: 0.49 mg/dL (ref 0.44–1.00)
GFR, Estimated: >60 mL/min (ref >60)
Glucose, Bld: 103 mg/dL — ABNORMAL HIGH (ref 70–99)
Potassium: 3.8 mmol/L (ref 3.5–5.1)
Sodium: 138 mmol/L (ref 135–145)
Total Bilirubin: 0.5 mg/dL (ref 0.3–1.2)
Total Protein: 6.9 g/dL (ref 6.5–8.1)

## 2022-04-01 LAB — PREGNANCY, URINE: Preg Test, Ur: POSITIVE — AB

## 2022-04-01 LAB — I-STAT BETA HCG BLOOD, ED (MC, WL, AP ONLY): I-stat hCG, quantitative: 39.5 m[IU]/mL — ABNORMAL HIGH (ref <5)

## 2022-04-01 MED ORDER — OXYCODONE-ACETAMINOPHEN 5-325 MG PO TABS: 1.0000 | ORAL_TABLET | ORAL | 0 refills | Status: DC | PRN

## 2022-04-01 MED ORDER — MISOPROSTOL 200 MCG PO TABS: ORAL_TABLET | ORAL | 1 refills | Status: AC

## 2022-04-01 MED ORDER — OXYCODONE-ACETAMINOPHEN 5-325 MG PO TABS: 1.0000 | ORAL_TABLET | ORAL | 0 refills | Status: AC | PRN

## 2022-04-01 NOTE — ED Notes (Signed)
Pt to MAU POV. Pt A&Ox4, NAD noted. Pt denies dizziness at this time. Pt ambulatory to discharge, instructed to go directly to MAU.Pt verbalized understanding. Pt given paperwork for MAU.  ?

## 2022-04-01 NOTE — ED Provider Notes (Signed)
?[facility]-EMERGENCY DEPT ?[facility] Emergency Department ?Provider Note ?MRN:  [id number]  ?Arrival date & time: 04/01/22    ? ?Chief Complaint   ?Vaginal Bleeding ?  ?History of Present Illness   ?Yvonne Castillo is a 26 y.o. year-old female with no pertinent past medical presenting to the ED with chief complaint of vaginal bleeding. ? ?Patient took the abortion pill 1 month ago, has had some light bleeding since then but became much heavier over the past few days.  Since 9 PM has saturated multiple pads.  Endorsing dizziness. ? ?Review of Systems  ?A thorough review of systems was obtained and all systems are negative except as noted in the HPI and PMH.  ? ?Patient's Health History   ? ?Past Medical History:  ?Diagnosis Date  ? 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 Anxiety   ? Asthma   ? as child no current issues or medications  ? Chronic kidney disease   ? kidney stones-"yearly"  ?  ?Past Surgical History:  ?Procedure Laterality Date  ? WISDOM TOOTH EXTRACTION    ?  ?Family History  ?Problem Relation Age of Onset  ? Anemia Mother   ? Depression Mother   ? Asthma Brother   ? Diabetes Maternal Grandmother   ? Heart disease Paternal Grandfather   ?  ?Social History  ? ?Socioeconomic History  ? Marital status: Single  ?  Spouse name: Not on file  ? Number of children: Not on file  ? Years of education: Not on file  ? Highest education level: Not on file  ?Occupational History  ? Not on file  ?Tobacco Use  ? Smoking status: Some Days  ?  Packs/day: 0.25  ?  Types: Cigars, Cigarettes  ? Smokeless tobacco: Never  ? Tobacco comments:  ?  black and milds  ?Substance and Sexual Activity  ? Alcohol use: Not Currently  ?  Alcohol/week: 0.0 standard drinks  ?  Comment: Stopped April 2019  ? Drug use: No  ? Sexual activity: Yes  ?  Birth control/protection: None  ?Other Topics Concern  ? Not on file  ?Social History Narrative  ? Not on file  ? ?Social Determinants of Health  ? ?Financial Resource Strain: Not on file  ?Food Insecurity:  Not on file  ?Transportation Needs: Not on file  ?Physical Activity: Not on file  ?Stress: Not on file  ?Social Connections: Not on file  ?Intimate Partner Violence: Not on file  ?  ? ?Physical Exam  ? ?Vitals:  ? 04/01/22 0433 04/01/22 0543  ?BP: 128/84 131/87  ?Pulse: 75 89  ?Resp: 16 16  ?Temp: 98.8 ?F (37.1 ?C) 98 ?F (36.7 ?C)  ?SpO2: 100% 100%  ?  ?CONSTITUTIONAL: Well-appearing, NAD ?NEURO/PSYCH:  Alert and oriented x 3, no focal deficits ?EYES:  eyes equal and reactive ?ENT/NECK:  no LAD, no JVD ?CARDIO: Regular rate, well-perfused, normal S1 and S2 ?PULM:  CTAB no wheezing or rhonchi ?GI/GU:  non-distended, non-tender ?MSK/SPINE:  No gross deformities, no edema ?SKIN:  no rash, atraumatic ? ? ?*Additional and/or pertinent findings included in MDM below ? ?Diagnostic and Interventional Summary  ? ? EKG Interpretation ? ?Date/Time:    ?Ventricular Rate:    ?PR Interval:    ?QRS Duration:   ?QT Interval:    ?QTC Calculation:   ?R Axis:     ?Text Interpretation:   ?  ? ?  ? ?Labs Reviewed  ?CBC WITH DIFFERENTIAL/PLATELET - Abnormal; Notable for the following components:  ?  Result Value  ? WBC 11.3 (*)   ? RBC 3.76 (*)   ? Hemoglobin 11.6 (*)   ? HCT 34.8 (*)   ? Eosinophils Absolute 0.6 (*)   ? All other components within normal limits  ?COMPREHENSIVE METABOLIC PANEL - Abnormal; Notable for the following components:  ? Glucose, Bld 103 (*)   ? Calcium 8.8 (*)   ? All other components within normal limits  ?URINALYSIS, ROUTINE W REFLEX MICROSCOPIC - Abnormal; Notable for the following components:  ? Color, Urine RED (*)   ? APPearance HAZY (*)   ? Hgb urine dipstick LARGE (*)   ? Protein, ur 30 (*)   ? Leukocytes,Ua SMALL (*)   ? RBC / HPF >50 (*)   ? All other components within normal limits  ?PREGNANCY, URINE - Abnormal; Notable for the following components:  ? Preg Test, Ur POSITIVE (*)   ? All other components within normal limits  ?HCG, QUANTITATIVE, PREGNANCY - Abnormal; Notable for the following  components:  ? hCG, Beta Chain, Quant, S 41 (*)   ? All other components within normal limits  ?I-STAT BETA HCG BLOOD, ED ([facility], [facility], [facility] ONLY) - Abnormal; Notable for the following components:  ? I-stat hCG, quantitative 39.5 (*)   ? All other components within normal limits  ?  ?US OB Comp < 14 Wks  ?Final Result  ?  ?US OB Transvaginal  ?Final Result  ?  ?US OB LESS THAN 14 WEEKS WITH OB TRANSVAGINAL    (Results Pending)  ?  ?Medications - No data to display  ? ?Procedures  /  Critical Care ?Procedures ? ?ED Course and Medical Decision Making  ?Initial Impression and Ddx ?Increasing vaginal bleeding 1 month after taking medical abortion pill.  DDx includes retained products, missed abortion, menstrual bleeding.  Will obtain ultrasound. ? ?Past medical/surgical history that increases complexity of ED encounter: None ? ?Interpretation of Diagnostics ?I personally reviewed the laboratory assessment and my interpretation is as follows: No significant blood count or electrolyte disturbance.  Ultrasound revealing likely missed abortion. ? ?Patient Reassessment and Ultimate Disposition/Management ?Discussed case with Dr. Daryel November of OB/GYN, will transfer to MAU for further care. ? ?Patient management required discussion with the following services or consulting groups:  OB/GYN ? ?Complexity of Problems Addressed ?Acute illness or injury that poses threat of life of bodily function ? ?Additional Data Reviewed and Analyzed ?Further history obtained from: ?None ? ?Additional Factors Impacting ED Encounter Risk ?Consideration of hospitalization ? ?Elmer Sow. Pilar Plate, MD ?[facility] Medicine ?[facility] [email or web address] ? ?Final Clinical Impressions(s) / ED Diagnoses  ? ?  ICD-10-CM   ?1. Missed abortion  O02.1   ?  ?  ?ED Discharge Orders   ? ? None  ? ?  ?  ? ?Discharge Instructions Discussed with and Provided to Patient:  ? ? ? ?Discharge Instructions   ? ?  ?We are transferring you to the [facility] MAU for continued care.  You have elected to travel via private vehicle.  Please go straight to the [facility]. ? ? ? ? ?  ?Sabas Sous, MD ?04/01/22 [phone] ? ?

## 2022-04-01 NOTE — ED Notes (Signed)
Report called to MAU RN 

## 2022-04-01 NOTE — MAU Provider Note (Addendum)
?History  ?  ? ?CSN: [id number] ? ?Arrival date and time: 03/31/22 2348 ? ? Event Date/Time  ? First Provider Initiated Contact with Patient 04/01/22 0636   ?  ? ?Chief Complaint  ?Patient presents with  ? Vaginal Bleeding  ? ?[name] is a 26 y.o. [id number] at Unknown who presents to MAU for vaginal bleeding which began a month ago on April 16th. Patient states she went to [facility] to take the abortion pill and has been bleeding the entire time since then. Patient states it has been light for the past two weeks, and it even completely stopped one of those days, but the last two days it started back "very heavy." Patient states the day before yesterday it was "coming out like water" and she thought she was peeing. Then last night she started seeing large clots that were in the toilet and not on a pad, making it difficult to get a gauge on how big they were. Patient did not take photos of her bleeding. Patient states usually she would take photos of her bleeding, but she did not want to come at all, but patient states she called the [facility] who advised her to come in and be seen. ? ?Patient states when she went to [facility], she took one pill in the office, and then she was given pills to take (4 pills) to take and then a second set to take 4 hours later. Patient states she has done a medication abortion before, and the expected results were normal after words and she "felt like she went through birth" and believes she passed a large sac at home. Patient states after this she felt much better. Patient states after she passed the sac, the bleeding gradually started to get lighter. Patient reports she was only supposed to f/u by taking a pregnancy test at home. Patient reports she did take a pregnancy test at home 2 weeks later and it was negative. Patient reports they did call to follow-up on the pregnant test results, but patient states because the results were positive, she did not feel the need to  call them back. ? ?Patient reports she did not think she was pregnant when she went to the ED tonight. Patient reports she has had sex since taking the medication abortion about 5 days ago. Patient reports she did not use a condom and is not on any birth control. Patient states she was given a prescription for birth control pills at the abortion office, but states that she did not take the prescription to a pharmacy to get filled yet. ? ?Patient is currently wearing a pad that has no blood on it at this time. ? ?Blood Type? O Positive ?Allergies? NKDA ?Current medications? none ?Current PNC & next appt? Pt does not have OB/GYN ? ?Pt denies vaginal discharge/odor/itching. ?Pt denies N/V, abdominal pain, constipation, diarrhea, or urinary problems. ?Pt denies fever, chills, fatigue, sweating or changes in appetite. ?Pt denies SOB or chest pain. ?Pt denies dizziness, HA, light-headedness, weakness. ? ? ?OB History   ? ? Gravida  ?14  ? Para  ?4  ? Term  ?3  ? Preterm  ?1  ? AB  ?9  ? Living  ?3  ?  ? ? SAB  ?2  ? IAB  ?7  ? Ectopic  ?0  ? Multiple  ?0  ? Live Births  ?4  ?   ?  ?  ? ? ?Past Medical History:  ?  Diagnosis Date  ? 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 Anxiety   ? Asthma   ? as child no current issues or medications  ? Chronic kidney disease   ? kidney stones-"yearly"  ? ? ?Past Surgical History:  ?Procedure Laterality Date  ? WISDOM TOOTH EXTRACTION    ? ? ?Family History  ?Problem Relation Age of Onset  ? Anemia Mother   ? Depression Mother   ? Asthma Brother   ? Diabetes Maternal Grandmother   ? Heart disease Paternal Grandfather   ? ? ?Social History  ? ?Tobacco Use  ? Smoking status: Some Days  ?  Packs/day: 0.25  ?  Types: Cigars, Cigarettes  ? Smokeless tobacco: Never  ? Tobacco comments:  ?  black and milds  ?Substance Use Topics  ? Alcohol use: Not Currently  ?  Alcohol/week: 0.0 standard drinks  ?  Comment: Stopped April 2019  ? Drug use: No  ? ? ?Allergies: No Known Allergies ? ?Medications Prior to  Admission  ?Medication Sig Dispense Refill Last Dose  ? HYDROcodone-acetaminophen (NORCO/VICODIN) 5-325 MG tablet Take 2 tablets by mouth every 4 (four) hours as needed. 10 tablet 0 More than a month  ? ondansetron (ZOFRAN) 4 MG tablet Take 1 tablet (4 mg total) by mouth every 6 (six) hours. 12 tablet 0 More than a month  ? tamsulosin (FLOMAX) 0.4 MG CAPS capsule Take 1 capsule (0.4 mg total) by mouth daily. 30 capsule 0 More than a month  ? ? ?Review of Systems  ?Constitutional:  Negative for chills, diaphoresis, fatigue and fever.  ?Eyes:  Negative for visual disturbance.  ?Respiratory:  Negative for shortness of breath.   ?Cardiovascular:  Negative for chest pain.  ?Gastrointestinal:  Negative for abdominal pain, constipation, diarrhea, nausea and vomiting.  ?Genitourinary:  Positive for vaginal bleeding. Negative for dysuria, flank pain, frequency, pelvic pain, urgency and vaginal discharge.  ?Neurological:  Negative for dizziness, weakness, light-headedness and headaches.  ? ?Physical Exam  ? ?Blood pressure 131/87, pulse 89, temperature 98 ?F (36.7 ?C), resp. rate 16, height 5\' 3"  (1.6 m), weight 72 kg, last menstrual period 12/26/2021, SpO2 100 %, unknown if currently breastfeeding. ? ?Patient Vitals for the past 24 hrs: ? BP Temp Temp src Pulse Resp SpO2 Height Weight  ?04/01/22 0543 131/87 98 ?F (36.7 ?C) -- 89 16 100 % 5\' 3"  (1.6 m) 72 kg  ?04/01/22 0433 128/84 98.8 ?F (37.1 ?C) -- 75 16 100 % -- --  ?04/01/22 0433 -- 98.8 ?F (37.1 ?C) Oral -- -- -- -- --  ?04/01/22 0400 128/84 -- -- 75 16 99 % -- --  ?04/01/22 0330 (!) 131/92 -- -- 70 16 100 % -- --  ?04/01/22 0320 (!) 156/91 -- -- 71 16 100 % -- --  ?04/01/22 0300 -- -- -- -- 16 -- -- --  ?04/01/22 0230 133/82 -- -- 64 -- 100 % -- --  ?04/01/22 0204 126/86 -- -- 65 15 100 % -- --  ?03/31/22 2355 (!) 159/107 98.2 ?F (36.8 ?C) Oral 91 16 98 % -- --  ? ?Physical Exam ?Vitals and nursing note reviewed.  ?Constitutional:   ?   General: She is not in acute  distress. ?   Appearance: Normal appearance. She is not ill-appearing, toxic-appearing or diaphoretic.  ?HENT:  ?   Head: Normocephalic and atraumatic.  ?Pulmonary:  ?   Effort: Pulmonary effort is normal.  ?Neurological:  ?   Mental Status: She is alert  and oriented to person, place, and time.  ?Psychiatric:     ?   Mood and Affect: Mood normal.     ?   Behavior: Behavior normal.     ?   Thought Content: Thought content normal.     ?   Judgment: Judgment normal.  ? ?Results for orders placed or performed during the hospital encounter of 03/31/22 (from the past 24 hour(s))  ?CBC with Differential     Status: Abnormal  ? Collection Time: 04/01/22  1:12 AM  ?Result Value Ref Range  ? WBC 11.3 (H) 4.0 - 10.5 K/uL  ? RBC 3.76 (L) 3.87 - 5.11 MIL/uL  ? Hemoglobin 11.6 (L) 12.0 - 15.0 g/dL  ? HCT 34.8 (L) 36.0 - 46.0 %  ? MCV 92.6 80.0 - 100.0 fL  ? MCH 30.9 26.0 - 34.0 pg  ? MCHC 33.3 30.0 - 36.0 g/dL  ? RDW 14.5 11.5 - 15.5 %  ? Platelets 258 150 - 400 K/uL  ? nRBC 0.0 0.0 - 0.2 %  ? Neutrophils Relative % 68 %  ? Neutro Abs 7.7 1.7 - 7.7 K/uL  ? Lymphocytes Relative 21 %  ? Lymphs Abs 2.3 0.7 - 4.0 K/uL  ? Monocytes Relative 5 %  ? Monocytes Absolute 0.6 0.1 - 1.0 K/uL  ? Eosinophils Relative 5 %  ? Eosinophils Absolute 0.6 (H) 0.0 - 0.5 K/uL  ? Basophils Relative 1 %  ? Basophils Absolute 0.1 0.0 - 0.1 K/uL  ? Immature Granulocytes 0 %  ? Abs Immature Granulocytes 0.04 0.00 - 0.07 K/uL  ?Comprehensive metabolic panel     Status: Abnormal  ? Collection Time: 04/01/22  1:12 AM  ?Result Value Ref Range  ? Sodium 138 135 - 145 mmol/L  ? Potassium 3.8 3.5 - 5.1 mmol/L  ? Chloride 108 98 - 111 mmol/L  ? CO2 24 22 - 32 mmol/L  ? Glucose, Bld 103 (H) 70 - 99 mg/dL  ? BUN 8 6 - 20 mg/dL  ? Creatinine, Ser 0.49 0.44 - 1.00 mg/dL  ? Calcium 8.8 (L) 8.9 - 10.3 mg/dL  ? Total Protein 6.9 6.5 - 8.1 g/dL  ? Albumin 4.0 3.5 - 5.0 g/dL  ? AST 17 15 - 41 U/L  ? ALT 12 0 - 44 U/L  ? Alkaline Phosphatase 75 38 - 126 U/L  ? Total  Bilirubin 0.5 0.3 - 1.2 mg/dL  ? GFR, Estimated >60 >60 mL/min  ? Anion gap 6 5 - 15  ?hCG, quantitative, pregnancy     Status: Abnormal  ? Collection Time: 04/01/22  1:12 AM  ?Result Value Ref Range  ? hCG, Beta Dorena Dew

## 2022-04-01 NOTE — MAU Note (Signed)
Yvonne Castillo is a 26 y.o. at Unknown here in MAU reporting: Pt came from [facility] for vaginal bleeding. Last night went through 4-5 pads in two hours. Had large clots. Cramping in abdomen and back.  ?LMP: 12/26/21 ?Onset of complaint: 1 day ?Pain score: 8/10 ?Vitals:  ? 04/01/22 0433 04/01/22 0543  ?BP: 128/84 131/87  ?Pulse: 75 89  ?Resp: 16 16  ?Temp: 98.8 ?F (37.1 ?C) 98 ?F (36.7 ?C)  ?SpO2: 100% 100%  ?   ?Lab orders placed from triage:  ? ?

## 2022-04-01 NOTE — ED Notes (Signed)
US at bedside

## 2022-04-08 ENCOUNTER — Ambulatory Visit: Payer: 59 | Admitting: Family Medicine

## 2022-04-08 ENCOUNTER — Other Ambulatory Visit: Payer: 59

## 2022-04-08 DIAGNOSIS — O039 Complete or unspecified spontaneous abortion without complication: Secondary | ICD-10-CM

## 2022-04-09 LAB — BETA HCG QUANT (REF LAB): hCG Quant: 5 m[IU]/mL

## 2022-04-11 ENCOUNTER — Telehealth: Payer: Self-pay

## 2022-04-11 NOTE — Telephone Encounter (Signed)
Call placed to pt. Spoke with pt. Pt given results and recommendations per Dr Adrian Blackwater. Pt verbalized understanding. Pt agreeable to appt in 2-3 weeks with any provider to talk about contraception. Front office to schedule. Message sent. Laney Pastor

## 2022-04-11 NOTE — Telephone Encounter (Signed)
-----   Message from Levie Heritage, DO sent at 04/09/2022 12:27 PM EDT ----- HCG is normal. F/u in 2-3 weeks for contraception.

## 2022-05-06 ENCOUNTER — Ambulatory Visit: Payer: 59 | Admitting: Medical

## 2022-05-09 NOTE — Progress Notes (Signed)
Closed encounter per Tanisha Hall, AD 

## 2022-05-11 ENCOUNTER — Emergency Department (HOSPITAL_COMMUNITY)
Admission: EM | Admit: 2022-05-11 | Discharge: 2022-05-11 | Disposition: A | Discharge status: Home / Self Care | Payer: 59 | Attending: Emergency Medicine | Admitting: Emergency Medicine

## 2022-05-11 ENCOUNTER — Emergency Department (HOSPITAL_COMMUNITY): Payer: 59

## 2022-05-11 ENCOUNTER — Other Ambulatory Visit: Payer: Self-pay

## 2022-05-11 ENCOUNTER — Encounter (HOSPITAL_COMMUNITY): Payer: Self-pay | Admitting: Emergency Medicine

## 2022-05-11 DIAGNOSIS — J4521 Mild intermittent asthma with (acute) exacerbation: Secondary | ICD-10-CM

## 2022-05-11 DIAGNOSIS — R0602 Shortness of breath: Secondary | ICD-10-CM | POA: Diagnosis present

## 2022-05-11 MED ORDER — ALBUTEROL SULFATE HFA 108 (90 BASE) MCG/ACT IN AERS
2.0000 | INHALATION_SPRAY | RESPIRATORY_TRACT | Status: DC | PRN
Start: 2022-05-11 — End: 2022-05-11
  Administered 2022-05-11: 2 via RESPIRATORY_TRACT
  Filled 2022-05-11: qty 6.7

## 2022-05-11 MED ORDER — PREDNISONE 20 MG PO TABS
60.0000 mg | ORAL_TABLET | Freq: Once | ORAL | Status: AC
Start: 2022-05-11 — End: 2022-05-11
  Administered 2022-05-11: 60 mg via ORAL
  Filled 2022-05-11: qty 3

## 2022-05-11 MED ORDER — ALBUTEROL SULFATE (2.5 MG/3ML) 0.083% IN NEBU
2.5000 mg | INHALATION_SOLUTION | Freq: Once | RESPIRATORY_TRACT | Status: AC
  Administered 2022-05-11: 2.5 mg via RESPIRATORY_TRACT
  Filled 2022-05-11: qty 3

## 2022-05-11 MED ORDER — PREDNISONE 20 MG PO TABS: ORAL_TABLET | ORAL | 0 refills | Status: AC

## 2022-05-11 NOTE — ED Notes (Signed)
Patient oxygen saturation remained 93-96% while ambulating in room.

## 2022-05-14 ENCOUNTER — Ambulatory Visit: Payer: 59 | Admitting: Family Medicine
